# Patient Record
Sex: Female | Born: 1985 | Race: Black or African American | Hispanic: No | Marital: Single | Smoking: Former smoker
Health system: Southern US, Community
[De-identification: ages and names within clinical notes are randomized; demographics above are authoritative.]

## PROBLEM LIST (undated history)

## (undated) ENCOUNTER — Inpatient Hospital Stay (HOSPITAL_COMMUNITY): Payer: Self-pay

## (undated) DIAGNOSIS — B999 Unspecified infectious disease: Secondary | ICD-10-CM

## (undated) DIAGNOSIS — D649 Anemia, unspecified: Secondary | ICD-10-CM

## (undated) DIAGNOSIS — K219 Gastro-esophageal reflux disease without esophagitis: Secondary | ICD-10-CM

## (undated) DIAGNOSIS — R4586 Emotional lability: Secondary | ICD-10-CM

## (undated) DIAGNOSIS — N83209 Unspecified ovarian cyst, unspecified side: Secondary | ICD-10-CM

## (undated) HISTORY — DX: Emotional lability: R45.86

## (undated) HISTORY — PX: TUBAL LIGATION: SHX77

## (undated) HISTORY — PX: INDUCED ABORTION: SHX677

## (undated) HISTORY — PX: TOOTH EXTRACTION: SUR596

---

## 2014-02-14 ENCOUNTER — Emergency Department (HOSPITAL_BASED_OUTPATIENT_CLINIC_OR_DEPARTMENT_OTHER)
Admission: EM | Admit: 2014-02-14 | Discharge: 2014-02-14 | Disposition: A | Discharge status: Home / Self Care | Payer: Medicaid - Out of State | Attending: Emergency Medicine | Admitting: Emergency Medicine

## 2014-02-14 ENCOUNTER — Encounter (HOSPITAL_BASED_OUTPATIENT_CLINIC_OR_DEPARTMENT_OTHER): Payer: Self-pay | Admitting: Emergency Medicine

## 2014-02-14 DIAGNOSIS — Z87891 Personal history of nicotine dependence: Secondary | ICD-10-CM | POA: Insufficient documentation

## 2014-02-14 DIAGNOSIS — O200 Threatened abortion: Secondary | ICD-10-CM | POA: Insufficient documentation

## 2014-02-14 HISTORY — DX: Unspecified ovarian cyst, unspecified side: N83.209

## 2014-02-14 LAB — URINE MICROSCOPIC-ADD ON

## 2014-02-14 LAB — URINALYSIS, ROUTINE W REFLEX MICROSCOPIC
Bilirubin Urine: NEGATIVE
Glucose, UA: NEGATIVE mg/dL
Ketones, ur: 15 mg/dL — AB
NITRITE: NEGATIVE
PH: 6.5 (ref 5.0–8.0)
Protein, ur: NEGATIVE mg/dL
Specific Gravity, Urine: 1.026 (ref 1.005–1.030)
Urobilinogen, UA: 1 mg/dL (ref 0.0–1.0)

## 2014-02-14 LAB — HCG, QUANTITATIVE, PREGNANCY: HCG, BETA CHAIN, QUANT, S: 41578 m[IU]/mL — AB (ref <5)

## 2014-02-14 LAB — PREGNANCY, URINE: Preg Test, Ur: POSITIVE — AB

## 2014-02-14 NOTE — ED Provider Notes (Signed)
CSN: [id number]     Arrival date & time 02/14/14  1929 History  This chart was scribed for Geoffery Lyonsouglas Emmanuela Ghazi, MD by Joaquin MusicKristina Sanchez-Matthews, ED Scribe. This patient was seen in room MH03/MH03 and the patient's care was started at 7:48 PM.   Chief Complaint  Patient presents with  . Vaginal Bleeding   Patient is a 28 y.o. female presenting with vaginal bleeding. The history is provided by the patient. No language interpreter was used.  Vaginal Bleeding Quality: thick and brown. Severity:  Moderate Onset quality:  Sudden Duration:  6 hours Timing:  Intermittent Progression:  Unchanged Chronicity:  New Menstrual history:  Irregular Possible pregnancy: yes   Context: at rest   Relieved by:  Nothing Worsened by:  Nothing tried Ineffective treatments:  None tried Associated symptoms: abdominal pain (suprapubic) and vaginal discharge   Associated symptoms: no back pain, no dizziness and no nausea   Abdominal pain:    Location:  Suprapubic   Quality:  Aching and pressure   Severity:  Moderate   Duration:  5 hours   Timing:  Constant   Progression:  Unchanged   Chronicity:  New Vaginal discharge:    Quality:  Brown and thick   Severity:  Moderate   Onset quality:  Sudden   Timing:  Constant   Progression:  Unchanged Risk factors: no hx of ectopic pregnancy    HPI Comments: Christine Juanetta GoslingHawkins is a 28 y.o. female who presents to the Emergency Department complaining of sudden vaginal bleeding that began this morning. Pt states she found out she was pregnant last week. She states she attempted to go to work but states she was having a HA and suprapubic pain and states she instead stayed home. Her LNMP was March 24-25, 2015. Pt states her blood/discharge is thick and dark brown. She states she has 3 healthy children, reports having an abortion 10 years ago and reports this being her 5th pregnancy. She states she was informed she was pregnant while at [facility] 1 week ago. She states  she went to ED for abd pain, had X-rays and labs done and states she was informed she had a possible UTI but states "she had a scramble egg" of a pregnancy. Denies being informed how far along she is or being discharged with medication due to pregnancy. Pt had an ultrasound done at [facility] and was informed everything WNL and the baby is in the appropriate place.  She denies nausea and emesis.   Past Medical History  Diagnosis Date  . Ovarian cyst    Past Surgical History  Procedure Laterality Date  . Cesarean section     No family history on file. History  Substance Use Topics  . Smoking status: Former Games developermoker  . Smokeless tobacco: Not on file  . Alcohol Use: No   OB History   Grav Para Term Preterm Abortions TAB SAB Ect Mult Living                 Review of Systems  Gastrointestinal: Positive for abdominal pain (suprapubic). Negative for nausea and vomiting.  Genitourinary: Positive for vaginal bleeding and vaginal discharge.  Musculoskeletal: Negative for back pain.  Neurological: Negative for dizziness.  All other systems reviewed and are negative.  Allergies  Review of patient's allergies indicates no known allergies.  Home Medications   Prior to Admission medications   Not on File   BP 119/76  Pulse 92  Temp(Src) 98.7 F (37.1 C) (Oral)  Resp 20  Ht 5\' 7"  (1.702 m)  Wt 200 lb (90.719 kg)  BMI 31.32 kg/m2  SpO2 100%  LMP 01/09/2014  Physical Exam  Nursing note and vitals reviewed. Constitutional: She is oriented to person, place, and time. She appears well-developed and well-nourished. No distress.  HENT:  Head: Normocephalic and atraumatic.  Eyes: EOM are normal.  Neck: Neck supple. No tracheal deviation present.  Cardiovascular: Normal rate.   Pulmonary/Chest: Effort normal. No respiratory distress.  Abdominal: Soft. She exhibits no mass. There is tenderness. There is no rebound and no guarding.  Mild tenderness in the suprapubic region.   Musculoskeletal: Normal range of motion.  Neurological: She is alert and oriented to person, place, and time.  Skin: Skin is warm and dry.  Psychiatric: She has a normal mood and affect. Her behavior is normal.   ED Course  Procedures DIAGNOSTIC STUDIES: Oxygen Saturation is 100% on RA, normal by my interpretation.    COORDINATION OF CARE: 7:53 PM-Discussed treatment plan which includes CBC and UA. Will review pts [facility] visit in order to avoid repeat of tx. Pt agreed to plan.   9:20 PM-Informed pt of lab findings. Advised pt to F/U with OB/GYN and to return to the ED if sx worsen in the next 24-48 hours. Pt agreed to plan.  Labs Review Labs Reviewed  URINALYSIS, ROUTINE W REFLEX MICROSCOPIC - Abnormal; Notable for the following:    APPearance CLOUDY (*)    Hgb urine dipstick SMALL (*)    Ketones, ur 15 (*)    Leukocytes, UA MODERATE (*)    All other components within normal limits  PREGNANCY, URINE - Abnormal; Notable for the following:    Preg Test, Ur POSITIVE (*)    All other components within normal limits  URINE MICROSCOPIC-ADD ON - Abnormal; Notable for the following:    Squamous Epithelial / LPF FEW (*)    Bacteria, UA FEW (*)    All other components within normal limits  HCG, QUANTITATIVE, PREGNANCY - Abnormal; Notable for the following:    hCG, Beta Chain, Quant, S [id number] (*)    All other components within normal limits    Imaging Review No results found.   EKG Interpretation None     MDM   Final diagnoses:  None    Patient is a 28 year old female who presents with vaginal bleeding. This is dark in color and she had one episode of this this afternoon. She denies any significant abdominal pain. She had an extensive workup performed 5 days ago at [facility]. She apparently had a pelvic exam, ultrasound, and full laboratory panel. Her beta hCG at that point was 13,000. Today this 41,000. I see no reason to repeat an ultrasound as we are  ready have documented an IUP. This may well represent a threatened abortion. She will be advised vaginal rest and followup with OB. The [facility].  I personally performed the services described in this documentation, which was scribed in my presence. The recorded information has been reviewed and is accurate.      Geoffery Lyonsouglas Ghali Morissette, MD 02/14/14 [phone]

## 2014-02-14 NOTE — ED Notes (Signed)
Brown vaginal Dc since this morning.  Found out 1 week ago that she is pregnant. LMP March 25th.

## 2014-02-14 NOTE — Discharge Instructions (Signed)
Followup with OB in the next week. The contact information has been provided on this discharge summary.  Return to the ER if he develops severe pain or worsening bleeding.   Threatened Miscarriage Bleeding during the first 20 weeks of pregnancy is common. This is sometimes called a threatened miscarriage. This is a pregnancy that is threatening to end before the twentieth week of pregnancy. Often this bleeding stops with bed rest or decreased activities as suggested by your caregiver and the pregnancy continues without any more problems. You may be asked to not have sexual intercourse, have orgasms or use tampons until further notice. Sometimes a threatened miscarriage can progress to a complete or incomplete miscarriage. This may or may not require further treatment. Some miscarriages occur before a woman misses a menstrual period and knows she is pregnant. Miscarriages occur in 15 to 20% of all pregnancies and usually occur during the first 13 weeks of the pregnancy. The exact cause of a miscarriage is usually never known. A miscarriage is natures way of ending a pregnancy that is abnormal or would not make it to term. There are some things that may put you at risk to have a miscarriage, such as:  Hormone problems.  Infection of the uterus or cervix.  Chronic illness, diabetes for example, especially if it is not controlled.  Abnormal shaped uterus.  Fibroids in the uterus.  Incompetent cervix (the cervix is too weak to hold the baby).  Smoking.  Drinking too much alcohol. It's best not to drink any alcohol when you are pregnant.  Taking illegal drugs. TREATMENT  When a miscarriage becomes complete and all products of conception (all the tissue in the uterus) have been passed, often no treatment is needed. If you think you passed tissue, save it in a container and take it to your doctor for evaluation. If the miscarriage is incomplete (parts of the fetus or placenta remain in the  uterus), further treatment may be needed. The most common reason for further treatment is continued bleeding (hemorrhage) because pregnancy tissue did not pass out of the uterus. This often occurs if a miscarriage is incomplete. Tissue left behind may also become infected. Treatment usually is dilatation and curettage (the removal of the remaining products of pregnancy. This can be done by a simple sucking procedure (suction curettage) or a simple scraping of the inside of the uterus. This may be done in the hospital or in the caregiver's office. This is only done when your caregiver knows that there is no chance for the pregnancy to proceed to term. This is determined by physical examination, negative pregnancy test, falling pregnancy hormone count and/or, an ultrasound revealing a dead fetus. Miscarriages are often a very emotional time for prospective mothers and fathers. This is not you or your partners fault. It did not occur because of an inadequacy in you or your partner. Nearly all miscarriages occur because the pregnancy has started off wrongly. At least half of these pregnancies have a chromosomal abnormality. It is almost always not inherited. Others may have developmental problems with the fetus or placenta. This does not always show up even when the products miscarried are studied under the microscope. The miscarriage is nearly always not your fault and it is not likely that you could have prevented it from happening. If you are having emotional and grieving problems, talk to your health care provider and even seek counseling, if necessary, before getting pregnant again. You can begin trying for another pregnancy as soon  as your caregiver says it is OK. HOME CARE INSTRUCTIONS   Your caregiver may order bed rest depending on how much bleeding and cramping you are having. You may be limited to only getting up to go to the bathroom. You may be allowed to continue light activity. You may need to make  arrangements for the care of your other children and for any other responsibilities.  Keep track of the number of pads you use each day, how often you have to change pads and how saturated (soaked) they are. Record this information.  DO NOT USE TAMPONS. Do not douche, have sexual intercourse or orgasms until approved by your caregiver.  You may receive a follow up appointment for re-evaluation of your pregnancy and a repeat blood test. Re-evaluation often occurs after 2 days and again in 4 to 6 weeks. It is very important that you follow-up in the recommended time period.  If you are Rh negative and the father is Rh positive or you do not know the fathers' blood type, you may receive a shot (Rh immune globulin) to help prevent abnormal antibodies that can develop and affect the baby in any future pregnancies. SEEK IMMEDIATE MEDICAL CARE IF:  You have severe cramps in your stomach, back, or abdomen.  You have a sudden onset of severe pain in the lower part of your abdomen.  You develop chills.  You run an unexplained temperature of 101 F (38.3 C) or higher.  You pass large clots or tissue. Save any tissue for your caregiver to inspect.  Your bleeding increases or you become light-headed, weak, or have fainting episodes.  You have a gush of fluid from your vagina.  You pass out. This could mean you have a tubal (ectopic) pregnancy. Document Released: 10/04/2005 Document Revised: 12/27/2011 Document Reviewed: 05/20/2008 [facility] Patient Information 2014 [employer] [place].

## 2014-02-14 NOTE — ED Notes (Signed)
C/o abd pain that comes and goes,  [employer]otBrown vag discharge x 1 day  Denies burning w urination

## 2014-03-17 ENCOUNTER — Encounter (HOSPITAL_COMMUNITY): Payer: Self-pay | Admitting: *Deleted

## 2014-03-17 ENCOUNTER — Inpatient Hospital Stay (HOSPITAL_COMMUNITY)
Admission: AD | Admit: 2014-03-17 | Discharge: 2014-03-17 | Disposition: A | Discharge status: Home / Self Care | Payer: Medicaid - Out of State | Source: Ambulatory Visit | Attending: Obstetrics and Gynecology | Admitting: Obstetrics and Gynecology

## 2014-03-17 ENCOUNTER — Inpatient Hospital Stay (HOSPITAL_COMMUNITY): Payer: Medicaid - Out of State

## 2014-03-17 DIAGNOSIS — M549 Dorsalgia, unspecified: Secondary | ICD-10-CM | POA: Insufficient documentation

## 2014-03-17 DIAGNOSIS — Z87891 Personal history of nicotine dependence: Secondary | ICD-10-CM | POA: Insufficient documentation

## 2014-03-17 DIAGNOSIS — O99891 Other specified diseases and conditions complicating pregnancy: Secondary | ICD-10-CM | POA: Insufficient documentation

## 2014-03-17 DIAGNOSIS — O9989 Other specified diseases and conditions complicating pregnancy, childbirth and the puerperium: Principal | ICD-10-CM

## 2014-03-17 DIAGNOSIS — R109 Unspecified abdominal pain: Secondary | ICD-10-CM | POA: Insufficient documentation

## 2014-03-17 LAB — URINALYSIS, ROUTINE W REFLEX MICROSCOPIC
Bilirubin Urine: NEGATIVE
Glucose, UA: NEGATIVE mg/dL
Ketones, ur: >80 mg/dL — AB
NITRITE: NEGATIVE
PH: 6 (ref 5.0–8.0)
Protein, ur: 30 mg/dL — AB
Urobilinogen, UA: 0.2 mg/dL (ref 0.0–1.0)

## 2014-03-17 LAB — URINE MICROSCOPIC-ADD ON

## 2014-03-17 IMAGING — US US OB COMP LESS 14 WK
1 series · 14 of 26 positions shown · non-contrast
Comparison: 02/08/2014.

CLINICAL DATA: Abdomen and back pain.  Pregnant

EXAM:
OBSTETRIC <14 WK ULTRASOUND
TECHNIQUE: Transabdominal ultrasound was performed for evaluation of the
gestation as well as the maternal uterus and adnexal regions.

[Series 1: us ob comp less 14 wks · 14 of 26 slices shown]
[im 1/26]
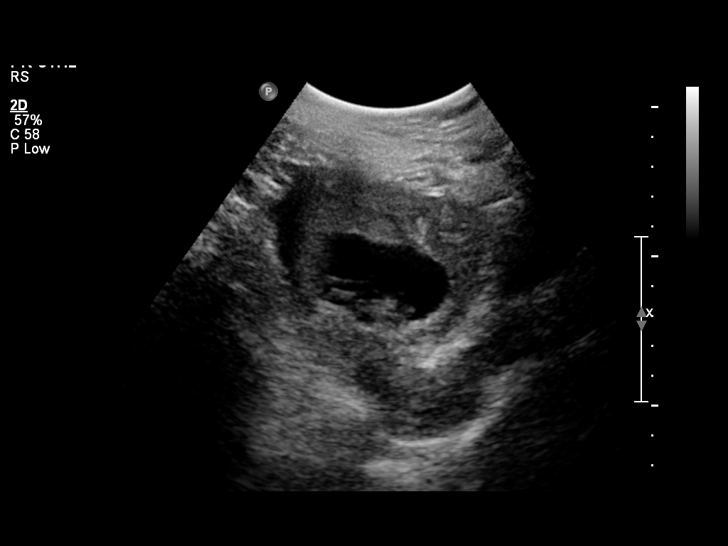
[im 3/26]
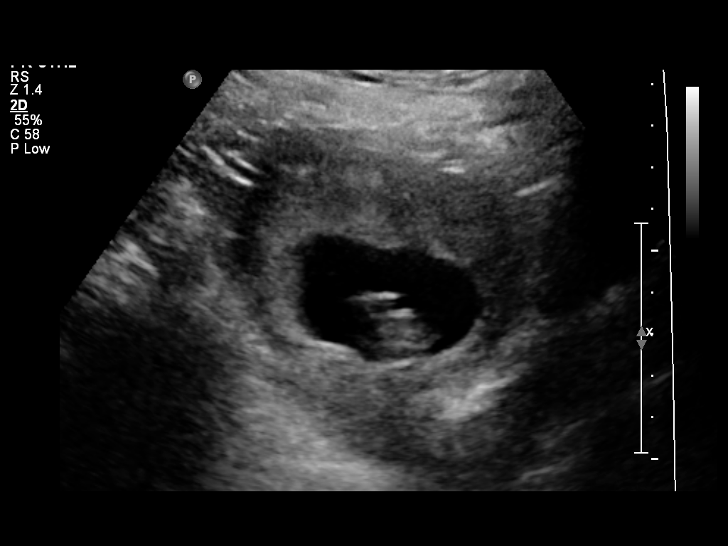
[im 5/26]
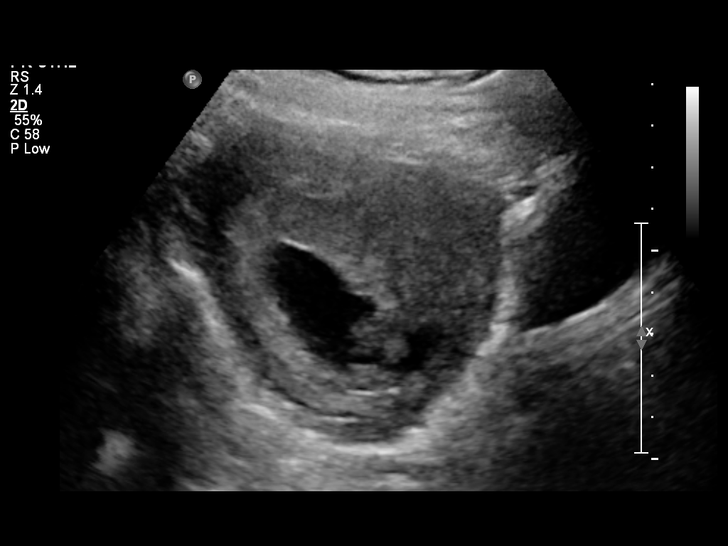
[im 7/26]
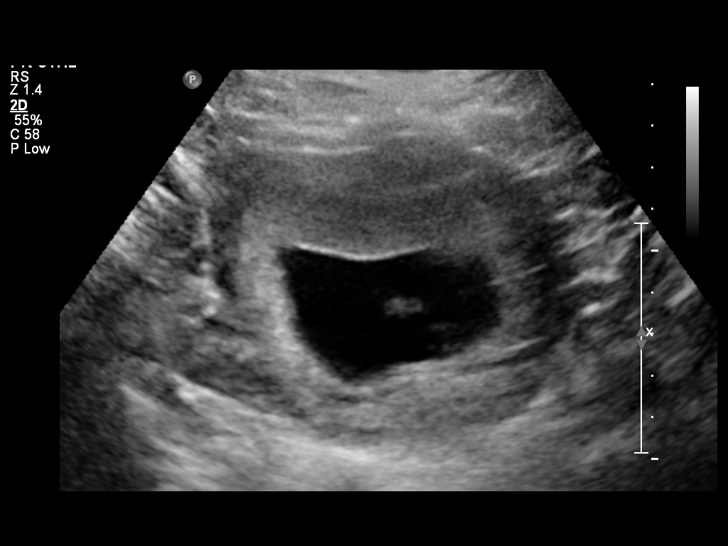
[im 9/26]
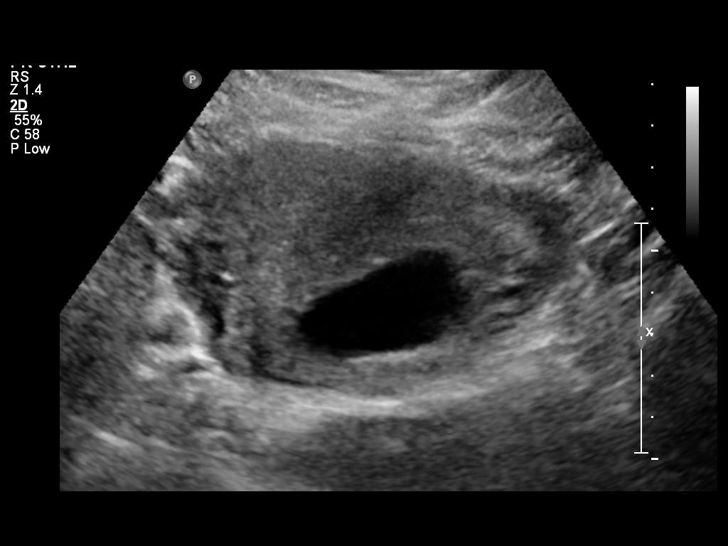
[im 11/26]
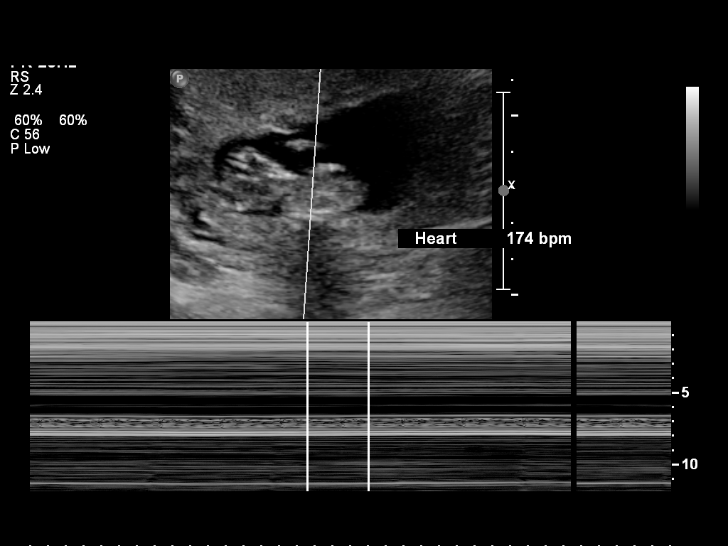
[im 13/26]
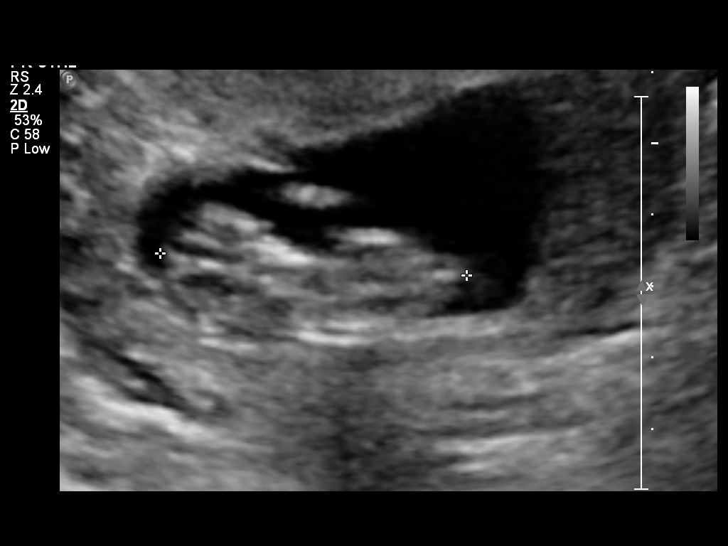
[im 14/26]
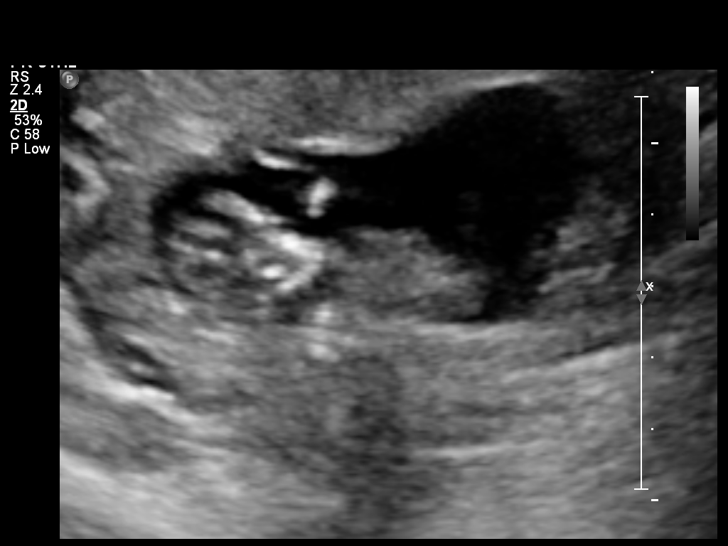
[im 16/26]
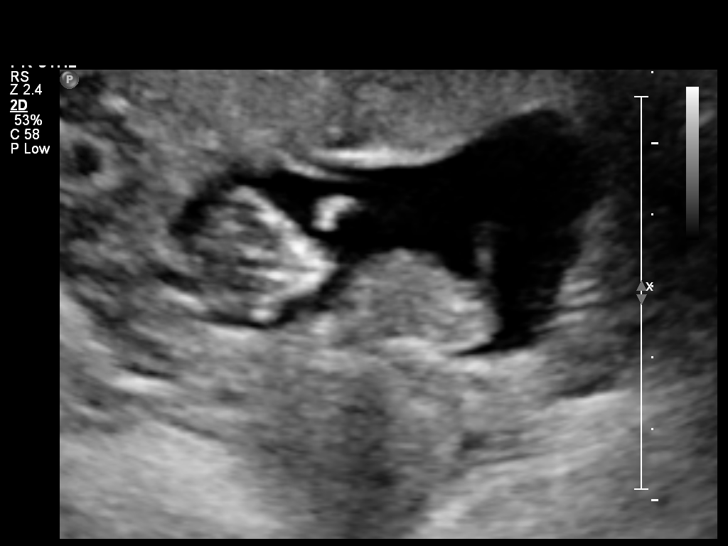
[im 18/26]
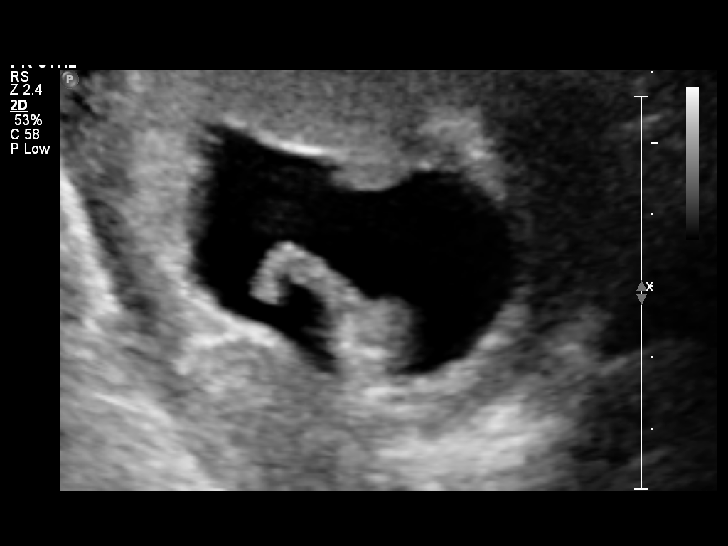
[im 20/26]
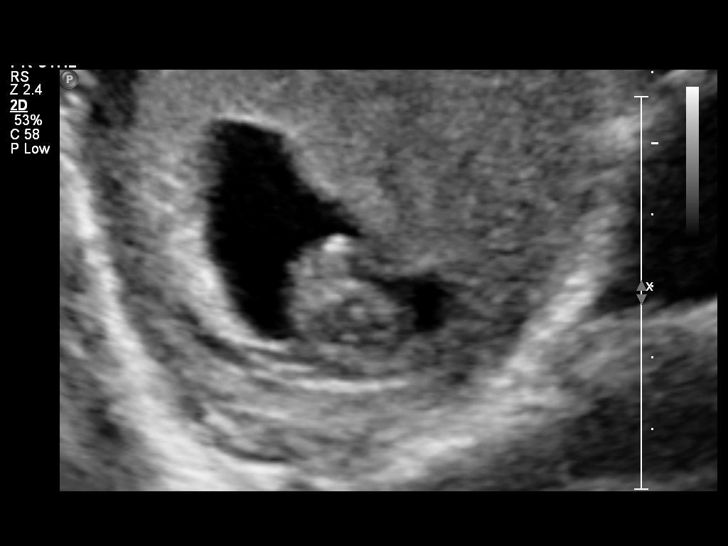
[im 22/26]
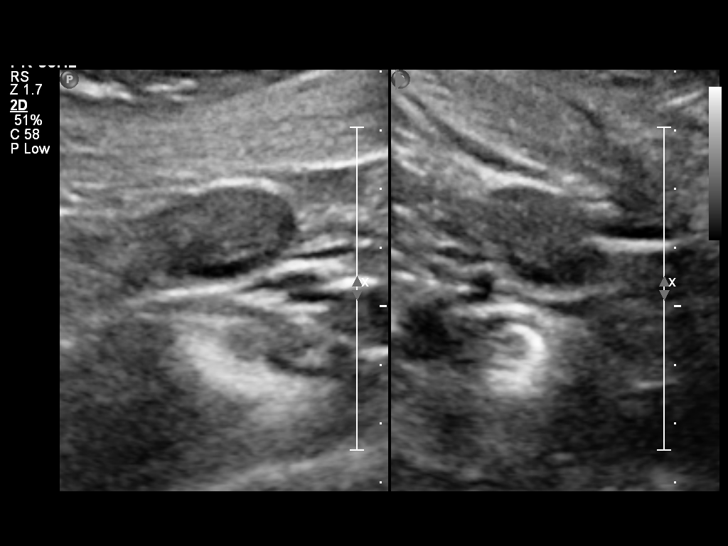
[im 24/26]
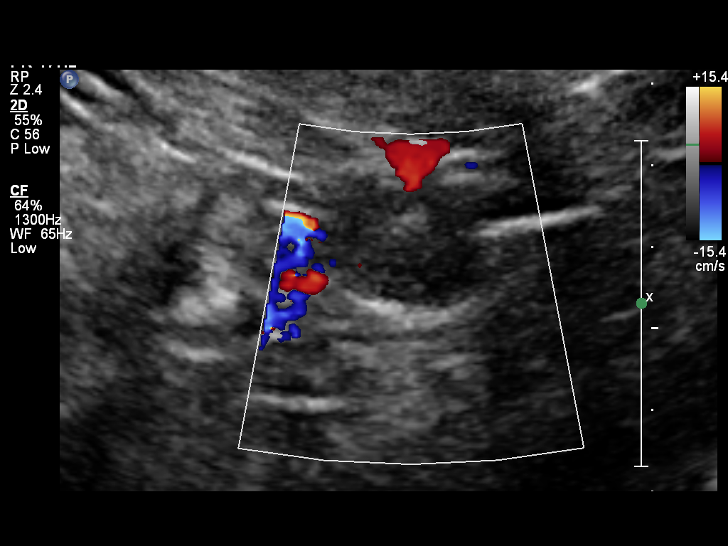
[im 26/26]
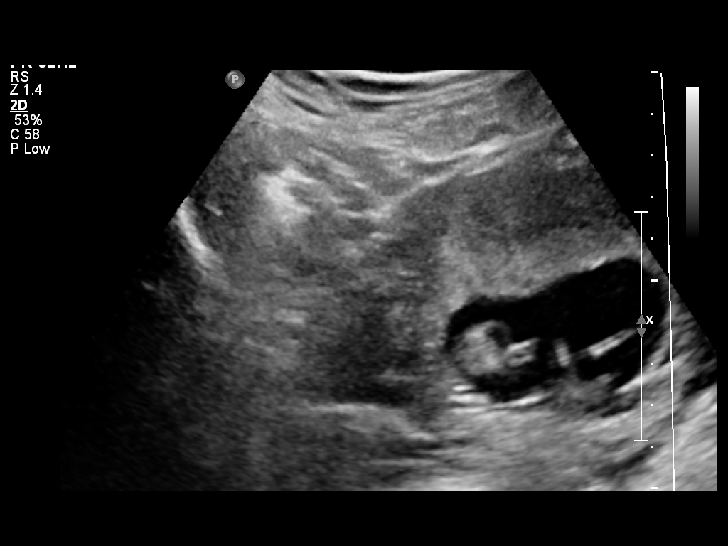

[14 of 26 positions shown; findings below may reference images not displayed]

FINDINGS: Intrauterine gestational sac: Visualized/normal in shape.

Yolk sac:  No

Embryo:  Yes

Cardiac Activity: Yes

Heart Rate: 174 bpm

CRL:   44  mm   11 w 2 d                  US EDC: 10/04/2014

Maternal uterus/adnexae: No subchorionic hemorrhage. No uterine
mass. Normal left ovary. Right ovary not visualized. No adnexal
masses. No free fluid.
IMPRESSION: Single live intrauterine pregnancy with a measured gestational age
of 11 weeks and 2 days, which is consistent with the expected growth
from the prior ultrasound. No emergent pregnancy or maternal
complication. No findings to explain abdominal pain.

## 2014-03-17 MED ORDER — CYCLOBENZAPRINE HCL 10 MG PO TABS: 10.0000 mg | ORAL_TABLET | Freq: Three times a day (TID) | ORAL | Status: DC | PRN

## 2014-03-17 MED ORDER — CYCLOBENZAPRINE HCL 10 MG PO TABS
10.0000 mg | ORAL_TABLET | Freq: Once | ORAL | Status: AC
  Administered 2014-03-17: 10 mg via ORAL
  Filled 2014-03-17: qty 1

## 2014-03-17 NOTE — Discharge Instructions (Signed)

## 2014-03-17 NOTE — MAU Provider Note (Signed)
History     CSN: [id number]  Arrival date and time: 03/17/14 2811   First Provider Initiated Contact with Patient 03/17/14 1923      Chief Complaint  Patient presents with  . Back Pain  . Abdominal Pain  . Dizziness   HPI This is a 28 y.o. female at [redacted]w[redacted]d who presents with c/o dizziness (she thinks due to nausea/vomiting) and low back pain which started last night. Has no abdominal pain now   Denies bleeding. Was seen at [facility] a month ago and had an [place] which she said showed "a small baby which was too small to see the heartbeat".    RN Note: Patient presents with complaints of dizziness, lower abdominal and lower back pain since last night       OB History   Grav Para Term Preterm Abortions TAB SAB Ect Mult Living   1               Past Medical History  Diagnosis Date  . Ovarian cyst     Past Surgical History  Procedure Laterality Date  . Cesarean section      History reviewed. No pertinent family history.  History  Substance Use Topics  . Smoking status: Former Games developer  . Smokeless tobacco: Never Used  . Alcohol Use: No    Allergies: No Known Allergies  No prescriptions prior to admission    Review of Systems  Constitutional: Negative for fever, chills and malaise/fatigue.  Eyes: Negative for blurred vision.  Gastrointestinal: Positive for nausea and vomiting. Negative for abdominal pain, diarrhea and constipation.  Genitourinary: Negative for dysuria.  Musculoskeletal: Positive for back pain.  Neurological: Positive for dizziness and weakness.   Physical Exam   Blood pressure 98/84, pulse 106, temperature 98.5 F (36.9 C), temperature source Oral, resp. rate 16, height 5\' 7"  (1.702 m), weight 85.985 kg (189 lb 9 oz), last menstrual period 01/09/2014.  Physical Exam  Constitutional: She is oriented to person, place, and time. She appears well-developed and well-nourished. No distress.  HENT:  Head: Normocephalic.  Cardiovascular:  Normal rate and regular rhythm.   Respiratory: Effort normal. No respiratory distress.  GI: Soft.  Genitourinary: Vagina normal and uterus normal. No vaginal discharge found.  Cervix long and closed No bleeding   Musculoskeletal: Normal range of motion.  Neurological: She is alert and oriented to person, place, and time.  Skin: Skin is warm and dry.  Psychiatric: She has a normal mood and affect.  Back is tender on bilateral paraspinous muscles  Results for orders placed during the hospital encounter of 03/17/14 (from the past 24 hour(s))  URINALYSIS, ROUTINE W REFLEX MICROSCOPIC     Status: Abnormal   Collection Time    03/17/14  6:55 PM      Result Value Ref Range   Color, Urine YELLOW  YELLOW   APPearance CLEAR  CLEAR   Specific Gravity, Urine >1.030 (*) 1.005 - 1.030   pH 6.0  5.0 - 8.0   Glucose, UA NEGATIVE  NEGATIVE mg/dL   Hgb urine dipstick SMALL (*) NEGATIVE   Bilirubin Urine NEGATIVE  NEGATIVE   Ketones, ur >80 (*) NEGATIVE mg/dL   Protein, ur 30 (*) NEGATIVE mg/dL   Urobilinogen, UA 0.2  0.0 - 1.0 mg/dL   Nitrite NEGATIVE  NEGATIVE   Leukocytes, UA TRACE (*) NEGATIVE  URINE MICROSCOPIC-ADD ON     Status: Abnormal   Collection Time    03/17/14  6:55 PM  Result Value Ref Range   Squamous Epithelial / LPF FEW (*) RARE   WBC, UA 3-6  <3 WBC/hpf   RBC / HPF 7-10  <3 RBC/hpf   Bacteria, UA FEW (*) RARE   Urine-Other MUCOUS PRESENT      MAU Course  Procedures  MDM Flexeril given for back Will check US to verify viability [place] Ob Comp Less 14 Wks  03/17/2014   CLINICAL DATA:  Abdomen and back pain.  Pregnant  EXAM: OBSTETRIC <14 WK ULTRASOUND  TECHNIQUE: Transabdominal ultrasound was performed for evaluation of the gestation as well as the maternal uterus and adnexal regions.  COMPARISON:  02/08/2014.    FINDINGS: Intrauterine gestational sac: Visualized/normal in shape.  Yolk sac:  No  Embryo:  Yes  Cardiac Activity: Yes  Heart Rate: 174 bpm  CRL:   44  mm    11 w 2 d                  US EDC: 10/04/2014  Maternal uterus/adnexae: No subchorionic hemorrhage. No uterine mass. Normal left ovary. Right ovary not visualized. No adnexal masses. No free fluid.    IMPRESSION: Single live intrauterine pregnancy with a measured gestational age of [redacted] weeks and 2 days, which is consistent with the expected growth from the prior ultrasound. No emergent pregnancy or maternal complication. No findings to explain abdominal pain.     Electronically Signed   By: Amie Portlandavid  Ormond M.D.   On: 03/17/2014 21:10   Assessment and Plan  A:  SIUP at 6775w2d by US      Viable fetus       Back pain, probably spasm  P;  Discharge home       Rx Flexeril       Followup with prenatal care    Urine to culture (denies dysuria) due to UA results  Christine Cooke 03/17/2014, 7:28 PM

## 2014-03-17 NOTE — MAU Note (Signed)
Patient presents with complaints of dizziness, lower abdominal and lower back pain since last night

## 2014-03-18 NOTE — MAU Provider Note (Signed)
Attestation of Attending Supervision of Advanced Practitioner: Evaluation and management procedures were performed by the PA/NP/CNM/OB Fellow under my supervision/collaboration. Chart reviewed and agree with management and plan.  Christin Bach V 03/18/2014 1:02 PM

## 2014-05-12 ENCOUNTER — Encounter (HOSPITAL_BASED_OUTPATIENT_CLINIC_OR_DEPARTMENT_OTHER): Payer: Self-pay | Admitting: Emergency Medicine

## 2014-05-12 ENCOUNTER — Emergency Department (HOSPITAL_BASED_OUTPATIENT_CLINIC_OR_DEPARTMENT_OTHER)
Admission: EM | Admit: 2014-05-12 | Discharge: 2014-05-12 | Disposition: A | Discharge status: Home / Self Care | Payer: BC Managed Care – PPO | Attending: Emergency Medicine | Admitting: Emergency Medicine

## 2014-05-12 DIAGNOSIS — A499 Bacterial infection, unspecified: Secondary | ICD-10-CM | POA: Diagnosis not present

## 2014-05-12 DIAGNOSIS — O210 Mild hyperemesis gravidarum: Secondary | ICD-10-CM | POA: Insufficient documentation

## 2014-05-12 DIAGNOSIS — O2390 Unspecified genitourinary tract infection in pregnancy, unspecified trimester: Secondary | ICD-10-CM | POA: Diagnosis not present

## 2014-05-12 DIAGNOSIS — Z87891 Personal history of nicotine dependence: Secondary | ICD-10-CM | POA: Diagnosis not present

## 2014-05-12 DIAGNOSIS — B9689 Other specified bacterial agents as the cause of diseases classified elsewhere: Secondary | ICD-10-CM | POA: Diagnosis not present

## 2014-05-12 DIAGNOSIS — N760 Acute vaginitis: Secondary | ICD-10-CM | POA: Diagnosis not present

## 2014-05-12 DIAGNOSIS — O9989 Other specified diseases and conditions complicating pregnancy, childbirth and the puerperium: Secondary | ICD-10-CM | POA: Diagnosis not present

## 2014-05-12 DIAGNOSIS — Z9889 Other specified postprocedural states: Secondary | ICD-10-CM | POA: Insufficient documentation

## 2014-05-12 DIAGNOSIS — R109 Unspecified abdominal pain: Secondary | ICD-10-CM | POA: Insufficient documentation

## 2014-05-12 LAB — WET PREP, GENITAL
Trich, Wet Prep: NONE SEEN
Yeast Wet Prep HPF POC: NONE SEEN

## 2014-05-12 LAB — URINALYSIS, ROUTINE W REFLEX MICROSCOPIC
BILIRUBIN URINE: NEGATIVE
GLUCOSE, UA: NEGATIVE mg/dL
Hgb urine dipstick: NEGATIVE
Ketones, ur: 40 mg/dL — AB
LEUKOCYTES UA: NEGATIVE
Nitrite: NEGATIVE
PROTEIN: NEGATIVE mg/dL
Specific Gravity, Urine: 1.013 (ref 1.005–1.030)
Urobilinogen, UA: 2 mg/dL — ABNORMAL HIGH (ref 0.0–1.0)
pH: 6.5 (ref 5.0–8.0)

## 2014-05-12 MED ORDER — METRONIDAZOLE 500 MG PO TABS: 500.0000 mg | ORAL_TABLET | Freq: Two times a day (BID) | ORAL | Status: DC

## 2014-05-12 NOTE — ED Notes (Signed)
MD at bedside. 

## 2014-05-12 NOTE — ED Provider Notes (Signed)
CSN: [id number]     Arrival date & time 05/12/14  1713 History  This chart was scribed for Rolan Bucco, MD by Nicholos Johns, ED scribe. This patient was seen in room MH12/MH12 and the patient's care was started at 5:56 PM.    Chief Complaint  Patient presents with  . Dysuria  . Vaginal Discharge   The history is provided by the patient. No language interpreter was used.   HPI Comments: Christine Cooke is a 28 y.o. female who in [redacted] weeks pregnant presents to the Emergency Department complaining of intermittent suprapubic abdominal pressure and cramping and white vaginal discharge. Abdominal pressure worse with urination. Also reports some nausea and emesis with pregnancy and has had occasional small amount of blood in emesis, not persistent or worsening.  States she can feel the baby starting to move. This is patient's 5th pregnancy; had 3 full term through c-section, 1 abortion, and this will be 4th child. Just got Medicaid and card and states she is still trying to find an OB/GYN to see her for prenatal visits. Has so far been unsuccessful. Denies vaginal bleeding.   Past Medical History  Diagnosis Date  . Ovarian cyst    Past Surgical History  Procedure Laterality Date  . Cesarean section    . Induced abortion     History reviewed. No pertinent family history. History  Substance Use Topics  . Smoking status: Former Games developer  . Smokeless tobacco: Never Used  . Alcohol Use: No   OB History   Grav Para Term Preterm Abortions TAB SAB Ect Mult Living   5 3 3  1 1    3      Review of Systems  Constitutional: Negative for chills, diaphoresis and fatigue.  HENT: Negative for congestion, rhinorrhea and sneezing.   Eyes: Negative.   Respiratory: Negative for cough, chest tightness and shortness of breath.   Cardiovascular: Negative for chest pain and leg swelling.  Gastrointestinal: Positive for nausea, vomiting and abdominal pain. Negative for diarrhea and blood in stool.   Genitourinary: Positive for dysuria and vaginal discharge. Negative for frequency, hematuria, flank pain, vaginal bleeding and difficulty urinating.  Musculoskeletal: Negative for arthralgias and back pain.  Skin: Negative for rash.  Neurological: Negative for dizziness, speech difficulty, weakness, numbness and headaches.  All other systems reviewed and are negative.  Allergies  Review of patient's allergies indicates no known allergies.  Home Medications   Prior to Admission medications   Medication Sig Start Date End Date Taking? Authorizing Provider  cyclobenzaprine (FLEXERIL) 10 MG tablet Take 1 tablet (10 mg total) by mouth 3 (three) times daily as needed for muscle spasms. 03/17/14   Aviva Signs, CNM  metroNIDAZOLE (FLAGYL) 500 MG tablet Take 1 tablet (500 mg total) by mouth 2 (two) times daily. One po bid x 7 days 05/12/14   Rolan Bucco, MD   Triage vitals: BP 102/75  Pulse 104  Temp(Src) 97.9 F (36.6 C) (Oral)  Resp 18  Ht 5\' 6"  (1.676 m)  Wt 180 lb (81.647 kg)  BMI 29.07 kg/m2  SpO2 100%  LMP 01/09/2014  Physical Exam  Constitutional: She is oriented to person, place, and time. She appears well-developed and well-nourished.  HENT:  Head: Normocephalic and atraumatic.  Eyes: Pupils are equal, round, and reactive to light.  Neck: Normal range of motion. Neck supple.  Cardiovascular: Normal rate, regular rhythm and normal heart sounds.   Pulmonary/Chest: Effort normal and breath sounds normal. No respiratory distress. She has  no wheezes. She has no rales. She exhibits no tenderness.  Abdominal: Soft. Bowel sounds are normal. There is no tenderness. There is no rebound and no guarding.  Genitourinary: Cervix exhibits no motion tenderness. Right adnexum displays no tenderness. Left adnexum displays no tenderness. No bleeding around the vagina. Vaginal discharge found.  Some thick white discharge. Cervix is closed. No bleeding or pooling of fluid. No CMT or adnexal  tenderness.  Musculoskeletal: Normal range of motion. She exhibits no edema.  Lymphadenopathy:    She has no cervical adenopathy.  Neurological: She is alert and oriented to person, place, and time.  Skin: Skin is warm and dry. No rash noted.  Psychiatric: She has a normal mood and affect.    ED Course  Procedures (including critical care time) DIAGNOSTIC STUDIES: Oxygen Saturation is 100% on room air, normal by my interpretation.    COORDINATION OF CARE: At 6:01 PM: Discussed treatment plan with patient which includes a pelvic exam and lab work. Patient agrees.    Labs Review Results for orders placed during the hospital encounter of 05/12/14  WET PREP, GENITAL      Result Value Ref Range   Yeast Wet Prep HPF POC NONE SEEN  NONE SEEN   Trich, Wet Prep NONE SEEN  NONE SEEN   Clue Cells Wet Prep HPF POC TOO NUMEROUS TO COUNT (*) NONE SEEN   WBC, Wet Prep HPF POC FEW (*) NONE SEEN  URINALYSIS, ROUTINE W REFLEX MICROSCOPIC      Result Value Ref Range   Color, Urine YELLOW  YELLOW   APPearance CLEAR  CLEAR   Specific Gravity, Urine 1.013  1.005 - 1.030   pH 6.5  5.0 - 8.0   Glucose, UA NEGATIVE  NEGATIVE mg/dL   Hgb urine dipstick NEGATIVE  NEGATIVE   Bilirubin Urine NEGATIVE  NEGATIVE   Ketones, ur 40 (*) NEGATIVE mg/dL   Protein, ur NEGATIVE  NEGATIVE mg/dL   Urobilinogen, UA 2.0 (*) 0.0 - 1.0 mg/dL   Nitrite NEGATIVE  NEGATIVE   Leukocytes, UA NEGATIVE  NEGATIVE   No results found.   Imaging Review No results found.   EKG Interpretation None      MDM   Final diagnoses:  BV (bacterial vaginosis)   Pt with lower abd cramping, vaginal discharge in 2nd trimester pregnancy <20 weeks.  EDD 10/16/14 which puts her at 2073w4d.  +FHTs at 120.  Cervix closed.  +BV which will be treated with flagyl.  Denies need for antiemetic meds.  Has protein in urine, but BP good.  Advised pt to take a PNV.  Advised to make appt with ob/gyn ASAP.  [facility] as  needed until then.   I personally performed the services described in this documentation, which was scribed in my presence. The recorded information has been reviewed and is accurate.     Rolan BuccoMelanie Berry Gallacher, MD 05/12/14 [phone]

## 2014-05-12 NOTE — ED Notes (Signed)
Pt having pressure and pain at pelvis, especially when urinating.  Some yellow vaginal discharge.  No vaginal bleeding.  No known fever.  Pt having some emesis with pregnancy but pt concerned that she is having some blood in her emesis.  First prenatal visit in August.

## 2014-05-12 NOTE — Discharge Instructions (Signed)
Bacterial Vaginosis Bacterial vaginosis is a vaginal infection that occurs when the normal balance of bacteria in the vagina is disrupted. It results from an overgrowth of certain bacteria. This is the most common vaginal infection in women of childbearing age. Treatment is important to prevent complications, especially in pregnant women, as it can cause a premature delivery. CAUSES  Bacterial vaginosis is caused by an increase in harmful bacteria that are normally present in smaller amounts in the vagina. Several different kinds of bacteria can cause bacterial vaginosis. However, the reason that the condition develops is not fully understood. RISK FACTORS Certain activities or behaviors can put you at an increased risk of developing bacterial vaginosis, including:  Having a new sex partner or multiple sex partners.  Douching.  Using an intrauterine device (IUD) for contraception. Women do not get bacterial vaginosis from toilet seats, bedding, swimming pools, or contact with objects around them. SIGNS AND SYMPTOMS  Some women with bacterial vaginosis have no signs or symptoms. Common symptoms include:  Grey vaginal discharge.  A fishlike odor with discharge, especially after sexual intercourse.  Itching or burning of the vagina and vulva.  Burning or pain with urination. DIAGNOSIS  Your health care provider will take a medical history and examine the vagina for signs of bacterial vaginosis. A sample of vaginal fluid may be taken. Your health care provider will look at this sample under a microscope to check for bacteria and abnormal cells. A vaginal pH test may also be done.  TREATMENT  Bacterial vaginosis may be treated with antibiotic medicines. These may be given in the form of a pill or a vaginal cream. A second round of antibiotics may be prescribed if the condition comes back after treatment.  HOME CARE INSTRUCTIONS   Only take over-the-counter or prescription medicines as  directed by your health care provider.  If antibiotic medicine was prescribed, take it as directed. Make sure you finish it even if you start to feel better.  Do not have sex until treatment is completed.  Tell all sexual partners that you have a vaginal infection. They should see their health care provider and be treated if they have problems, such as a mild rash or itching.  Practice safe sex by using condoms and only having one sex partner. SEEK MEDICAL CARE IF:   Your symptoms are not improving after 3 days of treatment.  You have increased discharge or pain.  You have a fever. MAKE SURE YOU:   Understand these instructions.  Will watch your condition.  Will get help right away if you are not doing well or get worse. FOR MORE INFORMATION  Centers for Disease Control and Prevention, Division of STD Prevention: www.cdc.gov/std American Sexual Health Association (ASHA): www.ashastd.org  Document Released: 10/04/2005 Document Revised: 07/25/2013 Document Reviewed: 05/16/2013 ExitCare Patient Information 2015 [employer]. This information is not intended to replace advice given to you by your health care provider. Make sure you discuss any questions you have with your health care provider.  

## 2014-05-13 LAB — GC/CHLAMYDIA PROBE AMP
CT PROBE, AMP APTIMA: NEGATIVE
GC PROBE AMP APTIMA: NEGATIVE

## 2014-05-15 ENCOUNTER — Other Ambulatory Visit: Payer: Medicaid Other

## 2014-05-15 DIAGNOSIS — Z3492 Encounter for supervision of normal pregnancy, unspecified, second trimester: Secondary | ICD-10-CM

## 2014-05-16 LAB — OBSTETRIC PANEL
ANTIBODY SCREEN: NEGATIVE
BASOS ABS: 0 10*3/uL (ref 0.0–0.1)
Basophils Relative: 0 % (ref 0–1)
EOS ABS: 0 10*3/uL (ref 0.0–0.7)
EOS PCT: 0 % (ref 0–5)
HEMATOCRIT: 30.4 % — AB (ref 36.0–46.0)
Hemoglobin: 9.9 g/dL — ABNORMAL LOW (ref 12.0–15.0)
Hepatitis B Surface Ag: NEGATIVE
LYMPHS ABS: 1.6 10*3/uL (ref 0.7–4.0)
LYMPHS PCT: 24 % (ref 12–46)
MCH: 28.5 pg (ref 26.0–34.0)
MCHC: 32.6 g/dL (ref 30.0–36.0)
MCV: 87.6 fL (ref 78.0–100.0)
MONO ABS: 0.3 10*3/uL (ref 0.1–1.0)
Monocytes Relative: 5 % (ref 3–12)
Neutro Abs: 4.8 10*3/uL (ref 1.7–7.7)
Neutrophils Relative %: 71 % (ref 43–77)
Platelets: 201 10*3/uL (ref 150–400)
RBC: 3.47 MIL/uL — ABNORMAL LOW (ref 3.87–5.11)
RDW: 14.5 % (ref 11.5–15.5)
RUBELLA: 0.78 {index} (ref <0.90)
Rh Type: POSITIVE
WBC: 6.8 10*3/uL (ref 4.0–10.5)

## 2014-05-16 LAB — GC/CHLAMYDIA PROBE AMP
CT Probe RNA: NEGATIVE
GC Probe RNA: NEGATIVE

## 2014-05-16 LAB — HIV ANTIBODY (ROUTINE TESTING W REFLEX): HIV 1&2 Ab, 4th Generation: NONREACTIVE

## 2014-05-17 LAB — HEMOGLOBINOPATHY EVALUATION
HEMOGLOBIN OTHER: 0 %
HGB A2 QUANT: 2.8 % (ref 2.2–3.2)
HGB A: 96.8 % (ref 96.8–97.8)
Hgb F Quant: 0.4 % (ref 0.0–2.0)
Hgb S Quant: 0 %

## 2014-05-18 LAB — CULTURE, OB URINE: Colony Count: 75000

## 2014-05-18 LAB — CANNABANOIDS (GC/LC/MS), URINE: THC-COOH (GC/LC/MS), ur confirm: 445 ng/mL — AB (ref <5)

## 2014-05-21 ENCOUNTER — Ambulatory Visit (HOSPITAL_COMMUNITY)
Admission: RE | Admit: 2014-05-21 | Discharge: 2014-05-21 | Disposition: A | Discharge status: Home / Self Care | Payer: BC Managed Care – PPO | Source: Ambulatory Visit | Attending: Obstetrics & Gynecology | Admitting: Obstetrics & Gynecology

## 2014-05-21 DIAGNOSIS — Z3492 Encounter for supervision of normal pregnancy, unspecified, second trimester: Secondary | ICD-10-CM

## 2014-05-21 DIAGNOSIS — Z3689 Encounter for other specified antenatal screening: Secondary | ICD-10-CM | POA: Insufficient documentation

## 2014-05-21 LAB — PRESCRIPTION MONITORING PROFILE (19 PANEL)
Amphetamine/Meth: NEGATIVE ng/mL
Barbiturate Screen, Urine: NEGATIVE ng/mL
Benzodiazepine Screen, Urine: NEGATIVE ng/mL
Buprenorphine, Urine: NEGATIVE ng/mL
Carisoprodol, Urine: NEGATIVE ng/mL
Cocaine Metabolites: NEGATIVE ng/mL
Creatinine, Urine: 279.62 mg/dL
Fentanyl, Ur: NEGATIVE ng/mL
MDMA URINE: NEGATIVE ng/mL
Meperidine, Ur: NEGATIVE ng/mL
Methadone Screen, Urine: NEGATIVE ng/mL
Methaqualone: NEGATIVE ng/mL
Nitrites, Initial: NEGATIVE ug/mL
Opiate Screen, Urine: NEGATIVE ng/mL
Oxycodone Screen, Ur: NEGATIVE ng/mL
Phencyclidine, Ur: NEGATIVE ng/mL
Propoxyphene: NEGATIVE ng/mL
Tapentadol, urine: NEGATIVE ng/mL
Tramadol Scrn, Ur: NEGATIVE ng/mL
Zolpidem, Urine: NEGATIVE ng/mL
pH, Initial: 7.6 pH (ref 4.5–8.9)

## 2014-05-21 IMAGING — US US OB COMP +14 WK
1 series · 12 of 28 positions shown · non-contrast
Comparison: none

OBSTETRICS REPORT
                      (Signed Final 05/21/2014 [DATE])

Service(s) Provided
 US OB COMP + 14 WK                                    76805.1
Indications
 Basic anatomic survey
 Previous cesarean section
Fetal Evaluation
 Num Of Fetuses:    1
 Fetal Heart Rate:  146                          bpm
 Cardiac Activity:  Observed
 Presentation:      Cephalic
 Placenta:          Posterior, above cervical
                    os
 P. Cord            Visualized, central
 Insertion:
 Amniotic Fluid
 AFI FV:      Subjectively within normal limits
                                             Larg Pckt:     5.3  cm
Biometry
 BPD:     49.4  mm     G. Age:  20w 6d                CI:        77.84   70 - 86
                                                      FL/HC:      20.0   15.9 -
 HC:     177.2  mm     G. Age:  20w 2d       25  %    HC/AC:      1.09   1.06 -
 AC:     162.6  mm     G. Age:  21w 3d       69  %    FL/BPD:
 FL:      35.5  mm     G. Age:  21w 1d       64  %    FL/AC:      21.8   20 - 24
 NFT:     3.32  mm
 Est. FW:     404  gm    0 lb 14 oz      55  %
Gestational Age
 LMP:           18w 6d        Date:  01/09/14                 EDD:   10/16/14
 U/S Today:     21w 0d                                        EDD:   10/01/14
 Best:          20w 4d     Det. By:  Early Ultrasound         EDD:   10/04/14
Anatomy
 Cranium:          Appears normal         Aortic Arch:      Not well visualized
 Fetal Cavum:      Appears normal         Ductal Arch:      Appears normal
 Ventricles:       Appears normal         Diaphragm:        Appears normal
 Choroid Plexus:   Appears normal         Stomach:          Appears normal, left
                                                            sided
 Cerebellum:       Appears normal         Abdomen:          Appears normal
 Posterior Fossa:  Appears normal         Abdominal Wall:   Appears nml (cord
                                                            insert, abd wall)
 Nuchal Fold:      Appears normal         Cord Vessels:     Appears normal (3
                                                            vessel cord)
 Face:             Appears normal         Kidneys:          Appear normal
                   (orbits and profile)
 Lips:             Appears normal         Bladder:          Appears normal
 Heart:            Appears normal         Spine:            Appears normal
                   (4CH, axis, and
                   situs)
 RVOT:             Not well visualized    Lower             Appears normal
                                          Extremities:
 LVOT:             Not well visualized    Upper             Appears normal
 Other:  Parents do not wish to know sex of fetus. Fetus appears to be a male.
         Heels and 5th digit visualized. Nasal bone visualized. Technically
         difficult due to fetal position.
Targeted Anatomy
 Fetal Central Nervous System
 Cisterna Magna:
Cervix Uterus Adnexa
 Cervical Length:    4.3      cm
 Cervix:       Normal appearance by transabdominal scan.
 Left Ovary:    Within normal limits.
 Right Ovary:   Within normal limits.
 Adnexa:     No abnormality visualized.
Impression
INDICATION: 27 yr old [id number] at 16w9d by early ultrasound
 for fetal anatomic survey. Remote read.

[Series 1: us ob comp +14 wk · 82 acquisitions, 12 frames shown]
[im 4/82]
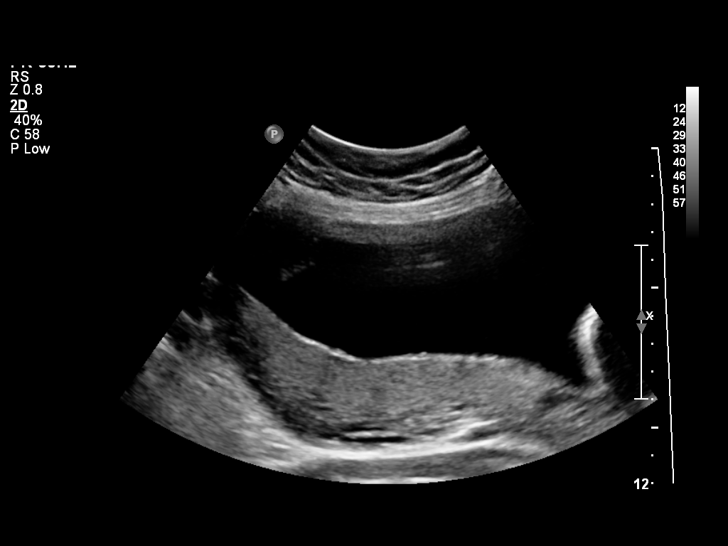
[im 10/82]
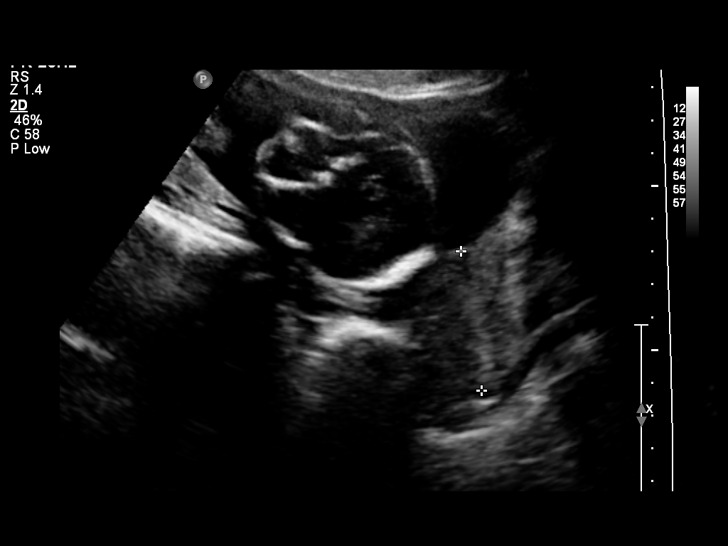
[im 16/82]
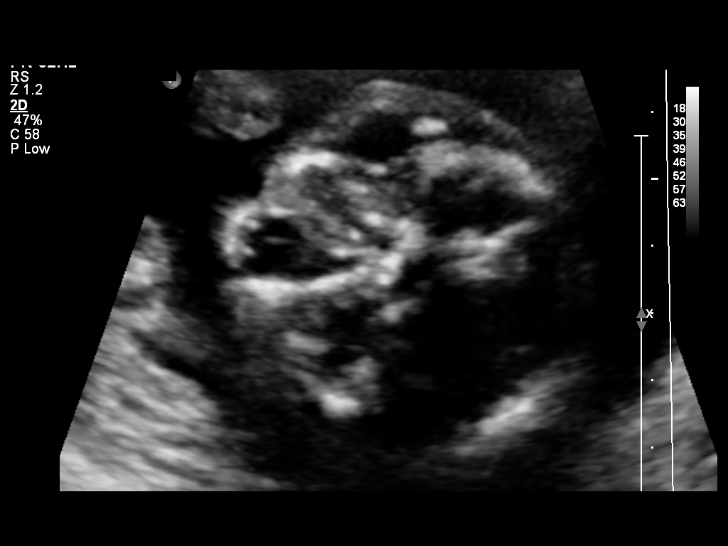
[im 25/82]
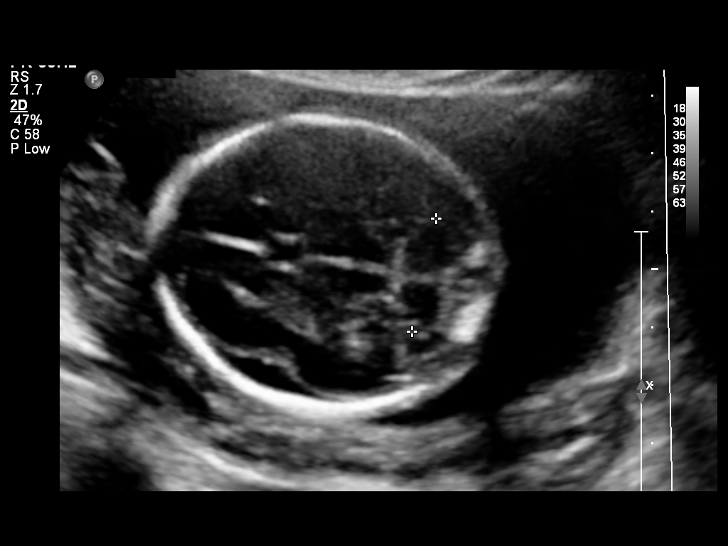
[im 31/82]
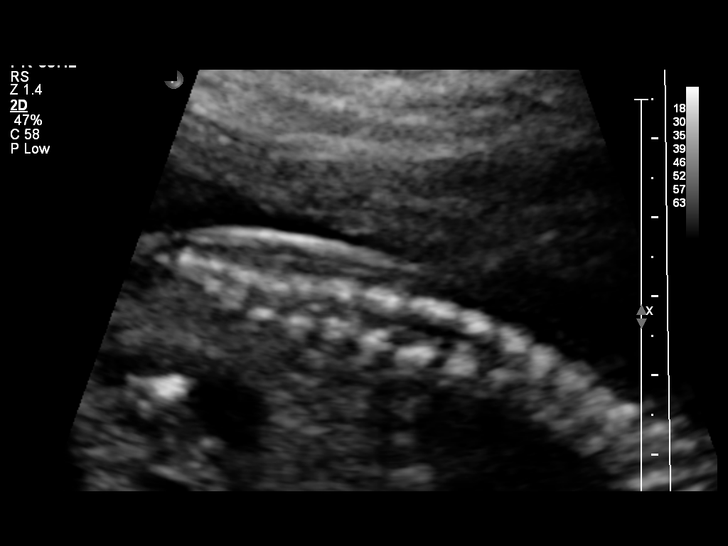
[im 37/82]
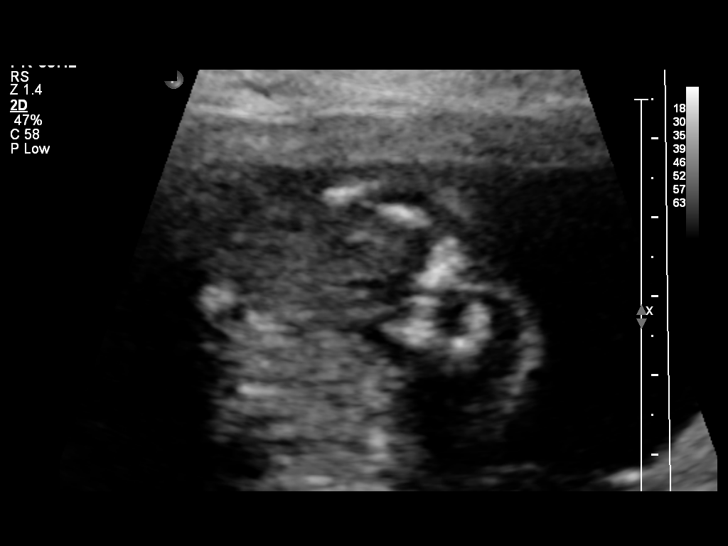
[im 46/82]
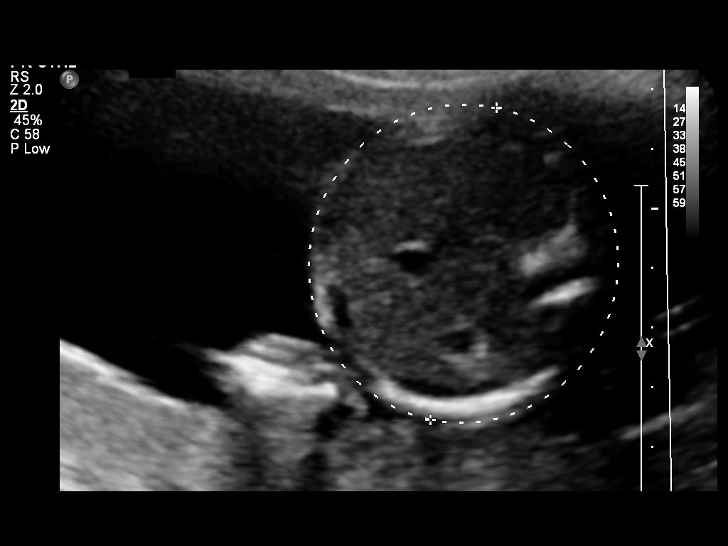
[im 52/82]
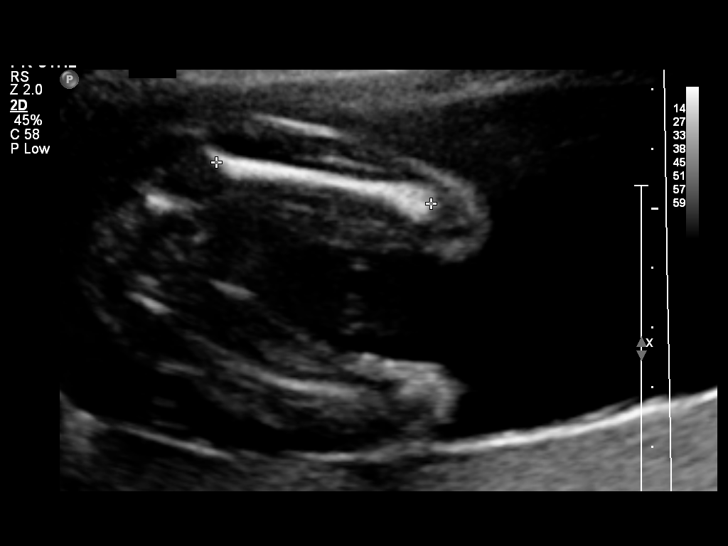
[im 58/82]
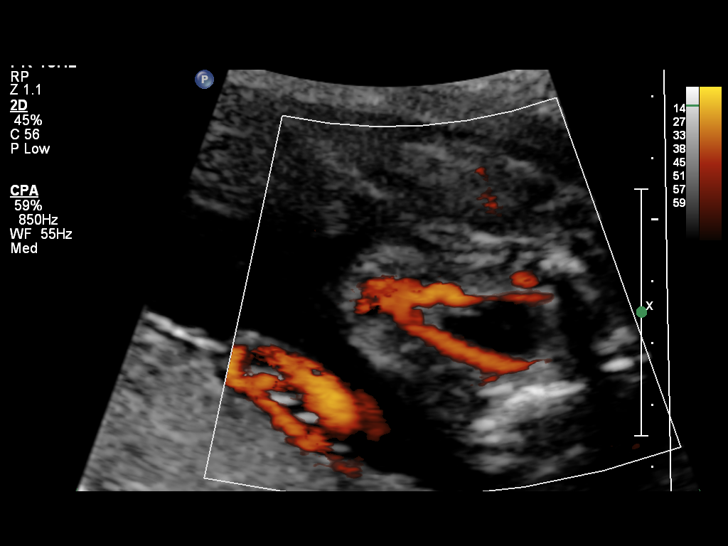
[im 67/82]
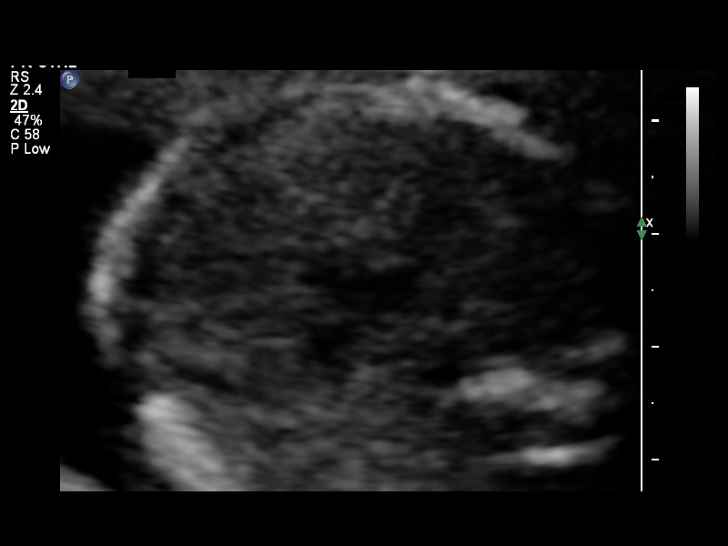
[im 73/82]
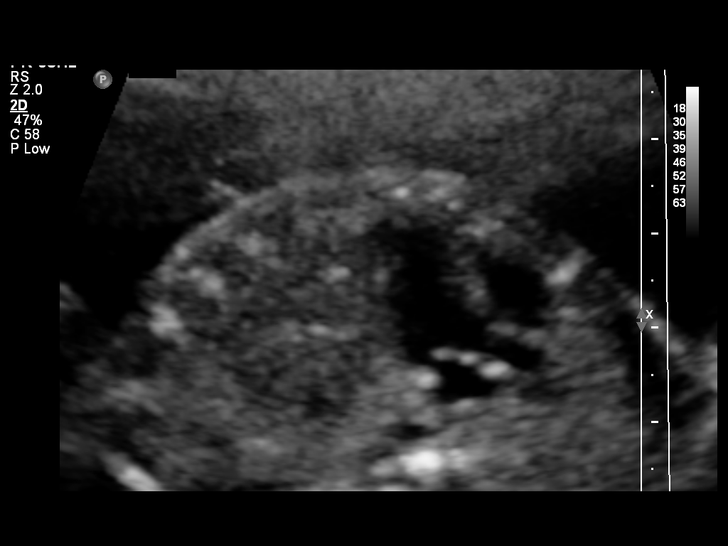
[im 79/82]
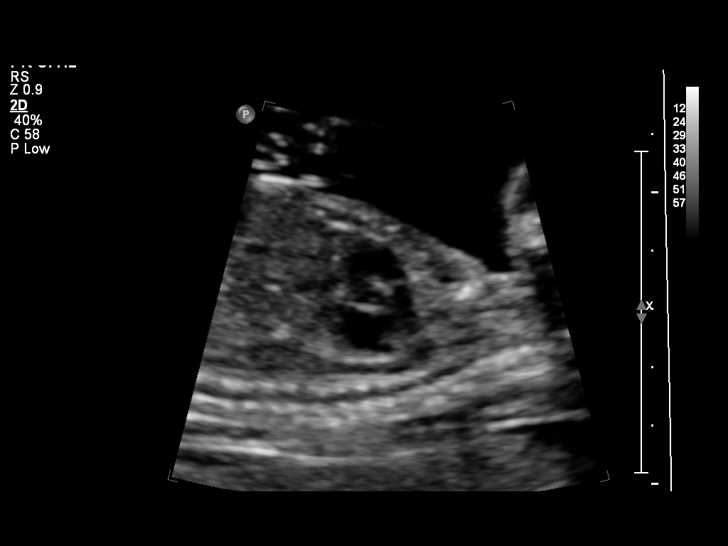

[12 of 28 positions shown; findings below may reference images not displayed]

FINDINGS: 1. Single intrauterine pregnancy.
 2. Fetal biometry is consistent with dating.
 3. Posterior placenta without evidence of previa.
 4. Normal amniotic fluid volume.
 5. Normal transabdominal cervical length.
 6. The views of the heart are limited.
 7. The remainder of the limited anatomy survey is normal.
Recommendations

 1. Appropriate fetal growth.
 2. Limited anatomy survey:
 - recommend follow up in 3-4 weeks to complete anatomic
 survey

## 2014-05-27 ENCOUNTER — Encounter: Payer: Self-pay | Admitting: Obstetrics & Gynecology

## 2014-05-27 DIAGNOSIS — O09899 Supervision of other high risk pregnancies, unspecified trimester: Secondary | ICD-10-CM | POA: Insufficient documentation

## 2014-05-27 DIAGNOSIS — Z283 Underimmunization status: Secondary | ICD-10-CM | POA: Insufficient documentation

## 2014-05-27 DIAGNOSIS — O9989 Other specified diseases and conditions complicating pregnancy, childbirth and the puerperium: Secondary | ICD-10-CM

## 2014-05-30 ENCOUNTER — Ambulatory Visit (INDEPENDENT_AMBULATORY_CARE_PROVIDER_SITE_OTHER): Payer: BC Managed Care – PPO | Admitting: Family Medicine

## 2014-05-30 ENCOUNTER — Encounter: Payer: Self-pay | Admitting: Family Medicine

## 2014-05-30 VITALS — BP 119/69 | HR 95 | Temp 98.7°F | Wt 185.9 lb

## 2014-05-30 DIAGNOSIS — F1290 Cannabis use, unspecified, uncomplicated: Secondary | ICD-10-CM | POA: Insufficient documentation

## 2014-05-30 DIAGNOSIS — O3421 Maternal care for scar from previous cesarean delivery: Secondary | ICD-10-CM

## 2014-05-30 DIAGNOSIS — F1210 Cannabis abuse, uncomplicated: Secondary | ICD-10-CM

## 2014-05-30 DIAGNOSIS — Z3492 Encounter for supervision of normal pregnancy, unspecified, second trimester: Secondary | ICD-10-CM

## 2014-05-30 DIAGNOSIS — Z3490 Encounter for supervision of normal pregnancy, unspecified, unspecified trimester: Secondary | ICD-10-CM | POA: Insufficient documentation

## 2014-05-30 DIAGNOSIS — Z3480 Encounter for supervision of other normal pregnancy, unspecified trimester: Secondary | ICD-10-CM

## 2014-05-30 DIAGNOSIS — O34219 Maternal care for unspecified type scar from previous cesarean delivery: Secondary | ICD-10-CM | POA: Insufficient documentation

## 2014-05-30 LAB — POCT URINALYSIS DIP (DEVICE)
BILIRUBIN URINE: NEGATIVE
GLUCOSE, UA: NEGATIVE mg/dL
Hgb urine dipstick: NEGATIVE
Ketones, ur: NEGATIVE mg/dL
LEUKOCYTES UA: NEGATIVE
Nitrite: NEGATIVE
Protein, ur: NEGATIVE mg/dL
Specific Gravity, Urine: 1.015 (ref 1.005–1.030)
UROBILINOGEN UA: 1 mg/dL (ref 0.0–1.0)
pH: 7 (ref 5.0–8.0)

## 2014-05-30 NOTE — Patient Instructions (Signed)
Offices that do circumcisions:  [facility] [phone] [facility]([place]) $317.20 within 4 weeks of birth [facility] [phone] [facility]([place]) $250 within 7 days of birth [facility] [phone] [facility]([place]) $175 within 2 weeks of birth

## 2014-05-30 NOTE — Progress Notes (Signed)
  Subjective:    Christine Cooke is a [name] 2113w6d being seen today for her first obstetrical visit.  Her obstetrical history is significant for hx of 3 previous LTCS. Patient does intend to breast feed. Pregnancy history fully reviewed.  Patient reports no complaints.  Reports 1st c/s was due to "broke my water too soon," dilated to 4 for "hours"  Birth control: desires BTL  Filed Vitals:   05/30/14 1031  BP: 119/69  Pulse: 95  Temp: 98.7 F (37.1 C)  Weight: 185 lb 14.4 oz (84.324 kg)    HISTORY: OB History  Gravida Para Term Preterm AB SAB TAB Ectopic Multiple Living  5 3 3  1  1   3     # Outcome Date GA Lbr Len/2nd Weight Sex Delivery Anes PTL Lv  5 CUR           4 TRM 07/03/12    M LTCS Spinal  Y  3 TRM 05/01/09    F LTCS Spinal  Y  2 TRM 10/29/06    F LTCS EPI  Y  1 TAB 2005             Past Medical History  Diagnosis Date  . Ovarian cyst    Past Surgical History  Procedure Laterality Date  . Cesarean section    . Induced abortion     Family History  Problem Relation Age of Onset  . Hypertension Mother   . Sickle cell anemia Sister   . Diabetes Maternal Aunt   . Hypertension Maternal Aunt   . Stroke Maternal Grandmother   . Asthma Maternal Grandmother   . Hypertension Maternal Grandmother   . Hyperlipidemia Maternal Grandmother   . Heart disease Maternal Grandmother      Exam    Uterus:     Pelvic Exam:    Perineum: No Hemorrhoids, Normal Perineum   Vulva: normal   Vagina:  normal mucosa   pH: Not performed   Cervix: retroverted   Adnexa: not evaluated   Bony Pelvis: Not evaluated  System: Breast:  normal appearance, no masses or tenderness   Skin: normal coloration and turgor, no rashes    Neurologic: oriented, normal, normal mood   Extremities: normal strength, tone, and muscle mass   HEENT PERRLA and extra ocular movement intact   Mouth/Teeth mucous membranes moist, pharynx normal without lesions   Neck supple and no goiter   Cardiovascular: Regular rate   Respiratory:  appears well, vitals normal, no respiratory distress, acyanotic, normal RR, ear and throat exam is normal, neck free of mass or lymphadenopathy   Abdomen: soft, nontender, no masses   Urinary: urethral meatus normal      Assessment:    Pregnancy: [name] Patient Active Problem List   Diagnosis Date Noted  . Supervision of normal pregnancy 05/30/2014  . Rubella non-immune status, antepartum 05/27/2014        Plan:     Initial labs drawn. Prenatal vitamins. Problem list reviewed and updated. Genetic Screening discussed - too late  Ultrasound discussed; fetal survey: results reviewed.  Follow up in 4 weeks. 50% of 25 min visit spent on counseling and coordination of care.  Bacteriuria: has not start abx yet, advised to pick them up and start them UDS: +THC, cessation advised  Christine Cooke 05/30/2014

## 2014-05-30 NOTE — Progress Notes (Signed)
Initial OB visit, new OB education material given

## 2014-05-31 LAB — GLUCOSE TOLERANCE, 1 HOUR (50G) W/O FASTING: Glucose, 1 Hour GTT: 114 mg/dL (ref 70–140)

## 2014-06-04 LAB — CYTOLOGY - PAP

## 2014-06-17 ENCOUNTER — Encounter: Payer: Self-pay | Admitting: Obstetrics & Gynecology

## 2014-06-26 ENCOUNTER — Ambulatory Visit (INDEPENDENT_AMBULATORY_CARE_PROVIDER_SITE_OTHER): Payer: Medicaid Other | Admitting: Physician Assistant

## 2014-06-26 ENCOUNTER — Encounter: Payer: Self-pay | Admitting: Physician Assistant

## 2014-06-26 VITALS — BP 112/71 | HR 101 | Wt 183.6 lb

## 2014-06-26 DIAGNOSIS — Z3480 Encounter for supervision of other normal pregnancy, unspecified trimester: Secondary | ICD-10-CM

## 2014-06-26 DIAGNOSIS — Z23 Encounter for immunization: Secondary | ICD-10-CM

## 2014-06-26 DIAGNOSIS — Z3492 Encounter for supervision of normal pregnancy, unspecified, second trimester: Secondary | ICD-10-CM

## 2014-06-26 LAB — POCT URINALYSIS DIP (DEVICE)
Glucose, UA: NEGATIVE mg/dL
KETONES UR: 40 mg/dL — AB
LEUKOCYTES UA: NEGATIVE
Nitrite: NEGATIVE
Protein, ur: 30 mg/dL — AB
Specific Gravity, Urine: 1.02 (ref 1.005–1.030)
Urobilinogen, UA: 2 mg/dL — ABNORMAL HIGH (ref 0.0–1.0)
pH: 6.5 (ref 5.0–8.0)

## 2014-06-26 NOTE — Progress Notes (Signed)
Needs rx for PNV 

## 2014-06-26 NOTE — Patient Instructions (Signed)
Breastfeeding Deciding to breastfeed is one of the best choices you can make for you and your baby. A change in hormones during pregnancy causes your breast tissue to grow and increases the number and size of your milk ducts. These hormones also allow proteins, sugars, and fats from your blood supply to make breast milk in your milk-producing glands. Hormones prevent breast milk from being released before your baby is born as well as prompt milk flow after birth. Once breastfeeding has begun, thoughts of your baby, as well as his or her sucking or crying, can stimulate the release of milk from your milk-producing glands.  BENEFITS OF BREASTFEEDING For Your Baby  Your first milk (colostrum) helps your baby's digestive system function better.   There are antibodies in your milk that help your baby fight off infections.   Your baby has a lower incidence of asthma, allergies, and sudden infant death syndrome.   The nutrients in breast milk are better for your baby than infant formulas and are designed uniquely for your baby's needs.   Breast milk improves your baby's brain development.   Your baby is less likely to develop other conditions, such as childhood obesity, asthma, or type 2 diabetes mellitus.  For You   Breastfeeding helps to create a very special bond between you and your baby.   Breastfeeding is convenient. Breast milk is always available at the correct temperature and costs nothing.   Breastfeeding helps to burn calories and helps you lose the weight gained during pregnancy.   Breastfeeding makes your uterus contract to its prepregnancy size faster and slows bleeding (lochia) after you give birth.   Breastfeeding helps to lower your risk of developing type 2 diabetes mellitus, osteoporosis, and breast or ovarian cancer later in life. SIGNS THAT YOUR BABY IS HUNGRY Early Signs of Hunger  Increased alertness or activity.  Stretching.  Movement of the head from  side to side.  Movement of the head and opening of the mouth when the corner of the mouth or cheek is stroked (rooting).  Increased sucking sounds, smacking lips, cooing, sighing, or squeaking.  Hand-to-mouth movements.  Increased sucking of fingers or hands. Late Signs of Hunger  Fussing.  Intermittent crying. Extreme Signs of Hunger Signs of extreme hunger will require calming and consoling before your baby will be able to breastfeed successfully. Do not wait for the following signs of extreme hunger to occur before you initiate breastfeeding:   Restlessness.  A loud, strong cry.   Screaming. BREASTFEEDING BASICS Breastfeeding Initiation  Find a comfortable place to sit or lie down, with your neck and back well supported.  Place a pillow or rolled up blanket under your baby to bring him or her to the level of your breast (if you are seated). Nursing pillows are specially designed to help support your arms and your baby while you breastfeed.  Make sure that your baby's abdomen is facing your abdomen.   Gently massage your breast. With your fingertips, massage from your chest wall toward your nipple in a circular motion. This encourages milk flow. You may need to continue this action during the feeding if your milk flows slowly.  Support your breast with 4 fingers underneath and your thumb above your nipple. Make sure your fingers are well away from your nipple and your baby's mouth.   Stroke your baby's lips gently with your finger or nipple.   When your baby's mouth is open wide enough, quickly bring your baby to your   breast, placing your entire nipple and as much of the colored area around your nipple (areola) as possible into your baby's mouth.   More areola should be visible above your baby's upper lip than below the lower lip.   Your baby's tongue should be between his or her lower gum and your breast.   Ensure that your baby's mouth is correctly positioned  around your nipple (latched). Your baby's lips should create a seal on your breast and be turned out (everted).  It is common for your baby to suck about 2-3 minutes in order to start the flow of breast milk. Latching Teaching your baby how to latch on to your breast properly is very important. An improper latch can cause nipple pain and decreased milk supply for you and poor weight gain in your baby. Also, if your baby is not latched onto your nipple properly, he or she may swallow some air during feeding. This can make your baby fussy. Burping your baby when you switch breasts during the feeding can help to get rid of the air. However, teaching your baby to latch on properly is still the best way to prevent fussiness from swallowing air while breastfeeding. Signs that your baby has successfully latched on to your nipple:    Silent tugging or silent sucking, without causing you pain.   Swallowing heard between every 3-4 sucks.    Muscle movement above and in front of his or her ears while sucking.  Signs that your baby has not successfully latched on to nipple:   Sucking sounds or smacking sounds from your baby while breastfeeding.  Nipple pain. If you think your baby has not latched on correctly, slip your finger into the corner of your baby's mouth to break the suction and place it between your baby's gums. Attempt breastfeeding initiation again. Signs of Successful Breastfeeding Signs from your baby:   A gradual decrease in the number of sucks or complete cessation of sucking.   Falling asleep.   Relaxation of his or her body.   Retention of a small amount of milk in his or her mouth.   Letting go of your breast by himself or herself. Signs from you:  Breasts that have increased in firmness, weight, and size 1-3 hours after feeding.   Breasts that are softer immediately after breastfeeding.  Increased milk volume, as well as a change in milk consistency and color by  the fifth day of breastfeeding.   Nipples that are not sore, cracked, or bleeding. Signs That Your Baby is Getting Enough Milk  Wetting at least 3 diapers in a 24-hour period. The urine should be clear and pale yellow by age 5 days.  At least 3 stools in a 24-hour period by age 5 days. The stool should be soft and yellow.  At least 3 stools in a 24-hour period by age 7 days. The stool should be seedy and yellow.  No loss of weight greater than 10% of birth weight during the first 3 days of age.  Average weight gain of 4-7 ounces (113-198 g) per week after age 4 days.  Consistent daily weight gain by age 5 days, without weight loss after the age of 2 weeks. After a feeding, your baby may spit up a small amount. This is common. BREASTFEEDING FREQUENCY AND DURATION Frequent feeding will help you make more milk and can prevent sore nipples and breast engorgement. Breastfeed when you feel the need to reduce the fullness of your breasts   or when your baby shows signs of hunger. This is called "breastfeeding on demand." Avoid introducing a pacifier to your baby while you are working to establish breastfeeding (the first 4-6 weeks after your baby is born). After this time you may choose to use a pacifier. Research has shown that pacifier use during the first year of a baby's life decreases the risk of sudden infant death syndrome (SIDS). Allow your baby to feed on each breast as long as he or she wants. Breastfeed until your baby is finished feeding. When your baby unlatches or falls asleep while feeding from the first breast, offer the second breast. Because newborns are often sleepy in the first few weeks of life, you may need to awaken your baby to get him or her to feed. Breastfeeding times will vary from baby to baby. However, the following rules can serve as a guide to help you ensure that your baby is properly fed:  Newborns (babies 4 weeks of age or younger) may breastfeed every 1-3  hours.  Newborns should not go longer than 3 hours during the day or 5 hours during the night without breastfeeding.  You should breastfeed your baby a minimum of 8 times in a 24-hour period until you begin to introduce solid foods to your baby at around 6 months of age. BREAST MILK PUMPING Pumping and storing breast milk allows you to ensure that your baby is exclusively fed your breast milk, even at times when you are unable to breastfeed. This is especially important if you are going back to work while you are still breastfeeding or when you are not able to be present during feedings. Your lactation consultant can give you guidelines on how long it is safe to store breast milk.  A breast pump is a machine that allows you to pump milk from your breast into a sterile bottle. The pumped breast milk can then be stored in a refrigerator or freezer. Some breast pumps are operated by hand, while others use electricity. Ask your lactation consultant which type will work best for you. Breast pumps can be purchased, but some hospitals and breastfeeding support groups lease breast pumps on a monthly basis. A lactation consultant can teach you how to hand express breast milk, if you prefer not to use a pump.  CARING FOR YOUR BREASTS WHILE YOU BREASTFEED Nipples can become dry, cracked, and sore while breastfeeding. The following recommendations can help keep your breasts moisturized and healthy:  Avoid using soap on your nipples.   Wear a supportive bra. Although not required, special nursing bras and tank tops are designed to allow access to your breasts for breastfeeding without taking off your entire bra or top. Avoid wearing underwire-style bras or extremely tight bras.  Air dry your nipples for 3-4minutes after each feeding.   Use only cotton bra pads to absorb leaked breast milk. Leaking of breast milk between feedings is normal.   Use lanolin on your nipples after breastfeeding. Lanolin helps to  maintain your skin's normal moisture barrier. If you use pure lanolin, you do not need to wash it off before feeding your baby again. Pure lanolin is not toxic to your baby. You may also hand express a few drops of breast milk and gently massage that milk into your nipples and allow the milk to air dry. In the first few weeks after giving birth, some women experience extremely full breasts (engorgement). Engorgement can make your breasts feel heavy, warm, and tender to the   touch. Engorgement peaks within 3-5 days after you give birth. The following recommendations can help ease engorgement:  Completely empty your breasts while breastfeeding or pumping. You may want to start by applying warm, moist heat (in the shower or with warm water-soaked hand towels) just before feeding or pumping. This increases circulation and helps the milk flow. If your baby does not completely empty your breasts while breastfeeding, pump any extra milk after he or she is finished.  Wear a snug bra (nursing or regular) or tank top for 1-2 days to signal your body to slightly decrease milk production.  Apply ice packs to your breasts, unless this is too uncomfortable for you.  Make sure that your baby is latched on and positioned properly while breastfeeding. If engorgement persists after 48 hours of following these recommendations, contact your health care provider or a lactation consultant. OVERALL HEALTH CARE RECOMMENDATIONS WHILE BREASTFEEDING  Eat healthy foods. Alternate between meals and snacks, eating 3 of each per day. Because what you eat affects your breast milk, some of the foods may make your baby more irritable than usual. Avoid eating these foods if you are sure that they are negatively affecting your baby.  Drink milk, fruit juice, and water to satisfy your thirst (about 10 glasses a day).   Rest often, relax, and continue to take your prenatal vitamins to prevent fatigue, stress, and anemia.  Continue  breast self-awareness checks.  Avoid chewing and smoking tobacco.  Avoid alcohol and drug use. Some medicines that may be harmful to your baby can pass through breast milk. It is important to ask your health care provider before taking any medicine, including all over-the-counter and prescription medicine as well as vitamin and herbal supplements. It is possible to become pregnant while breastfeeding. If birth control is desired, ask your health care provider about options that will be safe for your baby. SEEK MEDICAL CARE IF:   You feel like you want to stop breastfeeding or have become frustrated with breastfeeding.  You have painful breasts or nipples.  Your nipples are cracked or bleeding.  Your breasts are red, tender, or warm.  You have a swollen area on either breast.  You have a fever or chills.  You have nausea or vomiting.  You have drainage other than breast milk from your nipples.  Your breasts do not become full before feedings by the fifth day after you give birth.  You feel sad and depressed.  Your baby is too sleepy to eat well.  Your baby is having trouble sleeping.   Your baby is wetting less than 3 diapers in a 24-hour period.  Your baby has less than 3 stools in a 24-hour period.  Your baby's skin or the white part of his or her eyes becomes yellow.   Your baby is not gaining weight by 5 days of age. SEEK IMMEDIATE MEDICAL CARE IF:   Your baby is overly tired (lethargic) and does not want to wake up and feed.  Your baby develops an unexplained fever. Document Released: 10/04/2005 Document Revised: 10/09/2013 Document Reviewed: 03/28/2013 [employer] Patient Information 2015 [employer]. This information is not intended to replace advice given to you by your health care provider. Make sure you discuss any questions you have with your health care provider. Second Trimester of Pregnancy The second trimester is from week 13 through week 28, months 4  through 6. The second trimester is often a time when you feel your best. Your body has also   adjusted to being pregnant, and you begin to feel better physically. Usually, morning sickness has lessened or quit completely, you may have more energy, and you may have an increase in appetite. The second trimester is also a time when the fetus is growing rapidly. At the end of the sixth month, the fetus is about 9 inches long and weighs about 1 pounds. You will likely begin to feel the baby move (quickening) between 18 and 20 weeks of the pregnancy. BODY CHANGES Your body goes through many changes during pregnancy. The changes vary from woman to woman.   Your weight will continue to increase. You will notice your lower abdomen bulging out.  You may begin to get stretch marks on your hips, abdomen, and breasts.  You may develop headaches that can be relieved by medicines approved by your health care provider.  You may urinate more often because the fetus is pressing on your bladder.  You may develop or continue to have heartburn as a result of your pregnancy.  You may develop constipation because certain hormones are causing the muscles that push waste through your intestines to slow down.  You may develop hemorrhoids or swollen, bulging veins (varicose veins).  You may have back pain because of the weight gain and pregnancy hormones relaxing your joints between the bones in your pelvis and as a result of a shift in weight and the muscles that support your balance.  Your breasts will continue to grow and be tender.  Your gums may bleed and may be sensitive to brushing and flossing.  Dark spots or blotches (chloasma, mask of pregnancy) may develop on your face. This will likely fade after the baby is born.  A dark line from your belly button to the pubic area (linea nigra) may appear. This will likely fade after the baby is born.  You may have changes in your hair. These can include thickening of  your hair, rapid growth, and changes in texture. Some women also have hair loss during or after pregnancy, or hair that feels dry or thin. Your hair will most likely return to normal after your baby is born. WHAT TO EXPECT AT YOUR PRENATAL VISITS During a routine prenatal visit:  You will be weighed to make sure you and the fetus are growing normally.  Your blood pressure will be taken.  Your abdomen will be measured to track your baby's growth.  The fetal heartbeat will be listened to.  Any test results from the previous visit will be discussed. Your health care provider may ask you:  How you are feeling.  If you are feeling the baby move.  If you have had any abnormal symptoms, such as leaking fluid, bleeding, severe headaches, or abdominal cramping.  If you have any questions. Other tests that may be performed during your second trimester include:  Blood tests that check for:  Low iron levels (anemia).  Gestational diabetes (between 24 and 28 weeks).  Rh antibodies.  Urine tests to check for infections, diabetes, or protein in the urine.  An ultrasound to confirm the proper growth and development of the baby.  An amniocentesis to check for possible genetic problems.  Fetal screens for spina bifida and Down syndrome. HOME CARE INSTRUCTIONS   Avoid all smoking, herbs, alcohol, and unprescribed drugs. These chemicals affect the formation and growth of the baby.  Follow your health care provider's instructions regarding medicine use. There are medicines that are either safe or unsafe to take during pregnancy.    Exercise only as directed by your health care provider. Experiencing uterine cramps is a good sign to stop exercising.  Continue to eat regular, healthy meals.  Wear a good support bra for breast tenderness.  Do not use hot tubs, steam rooms, or saunas.  Wear your seat belt at all times when driving.  Avoid raw meat, uncooked cheese, cat litter boxes, and  soil used by cats. These carry germs that can cause birth defects in the baby.  Take your prenatal vitamins.  Try taking a stool softener (if your health care provider approves) if you develop constipation. Eat more high-fiber foods, such as fresh vegetables or fruit and whole grains. Drink plenty of fluids to keep your urine clear or pale yellow.  Take warm sitz baths to soothe any pain or discomfort caused by hemorrhoids. Use hemorrhoid cream if your health care provider approves.  If you develop varicose veins, wear support hose. Elevate your feet for 15 minutes, 3-4 times a day. Limit salt in your diet.  Avoid heavy lifting, wear low heel shoes, and practice good posture.  Rest with your legs elevated if you have leg cramps or low back pain.  Visit your dentist if you have not gone yet during your pregnancy. Use a soft toothbrush to brush your teeth and be gentle when you floss.  A sexual relationship may be continued unless your health care provider directs you otherwise.  Continue to go to all your prenatal visits as directed by your health care provider. SEEK MEDICAL CARE IF:   You have dizziness.  You have mild pelvic cramps, pelvic pressure, or nagging pain in the abdominal area.  You have persistent nausea, vomiting, or diarrhea.  You have a bad smelling vaginal discharge.  You have pain with urination. SEEK IMMEDIATE MEDICAL CARE IF:   You have a fever.  You are leaking fluid from your vagina.  You have spotting or bleeding from your vagina.  You have severe abdominal cramping or pain.  You have rapid weight gain or loss.  You have shortness of breath with chest pain.  You notice sudden or extreme swelling of your face, hands, ankles, feet, or legs.  You have not felt your baby move in over an hour.  You have severe headaches that do not go away with medicine.  You have vision changes. Document Released: 09/28/2001 Document Revised: 10/09/2013 Document  Reviewed: 12/05/2012 [employer] Patient Information 2015 [employer]. This information is not intended to replace advice given to you by your health care provider. Make sure you discuss any questions you have with your health care provider.  

## 2014-06-26 NOTE — Progress Notes (Signed)
25 weeks, stable.  Pt complaining of back pain - worse at night.  Uses many pillows to sleep Encouraged maternity support belt Diet discussed.  Encouraged 3-5 meals/day.  Eat even if no appetite to avoid further weight loss. She is congratulated for smoking cessation (THC) Flu today GTT/Tdap tomorrow RTC 3 weeks

## 2014-07-03 ENCOUNTER — Telehealth: Payer: Self-pay | Admitting: *Deleted

## 2014-07-03 ENCOUNTER — Encounter: Payer: Self-pay | Admitting: *Deleted

## 2014-07-03 NOTE — Telephone Encounter (Signed)
Patient dropped off a request for accomodation form for disabled patients. We are unable to complete this form as pregnancy is not a disability. Called patient and phone is not in service. Letter with pregnancy restrictions sent to patient.

## 2014-07-17 ENCOUNTER — Ambulatory Visit (INDEPENDENT_AMBULATORY_CARE_PROVIDER_SITE_OTHER): Payer: BC Managed Care – PPO | Admitting: Advanced Practice Midwife

## 2014-07-17 ENCOUNTER — Encounter: Payer: Self-pay | Admitting: Obstetrics & Gynecology

## 2014-07-17 DIAGNOSIS — Z23 Encounter for immunization: Secondary | ICD-10-CM | POA: Diagnosis not present

## 2014-07-17 DIAGNOSIS — Z3493 Encounter for supervision of normal pregnancy, unspecified, third trimester: Secondary | ICD-10-CM

## 2014-07-17 DIAGNOSIS — Z3492 Encounter for supervision of normal pregnancy, unspecified, second trimester: Secondary | ICD-10-CM

## 2014-07-17 DIAGNOSIS — Z3480 Encounter for supervision of other normal pregnancy, unspecified trimester: Secondary | ICD-10-CM

## 2014-07-17 LAB — POCT URINALYSIS DIP (DEVICE)
BILIRUBIN URINE: NEGATIVE
Glucose, UA: NEGATIVE mg/dL
Hgb urine dipstick: NEGATIVE
KETONES UR: NEGATIVE mg/dL
LEUKOCYTES UA: NEGATIVE
Nitrite: NEGATIVE
PROTEIN: NEGATIVE mg/dL
SPECIFIC GRAVITY, URINE: 1.015 (ref 1.005–1.030)
Urobilinogen, UA: 1 mg/dL (ref 0.0–1.0)
pH: 6.5 (ref 5.0–8.0)

## 2014-07-17 MED ORDER — CONCEPT OB 130-92.4-1 MG PO CAPS: 1.0000 | ORAL_CAPSULE | Freq: Every day | ORAL | Status: DC

## 2014-07-17 MED ORDER — TETANUS-DIPHTH-ACELL PERTUSSIS 5-2.5-18.5 LF-MCG/0.5 IM SUSP: 0.5000 mL | Freq: Once | INTRAMUSCULAR | Status: DC

## 2014-07-17 NOTE — Progress Notes (Signed)
Occasional contractions and pressure, but not more than 5 per hour. Declines VE. 28 weeks lab. TDaP.

## 2014-07-17 NOTE — Patient Instructions (Signed)
Tdap Vaccine (Tetanus, Diphtheria, Pertussis): What You Need to Know 1. Why get vaccinated? Tetanus, diphtheria and pertussis can be very serious diseases, even for adolescents and adults. Tdap vaccine can protect us from these diseases. TETANUS (Lockjaw) causes painful muscle tightening and stiffness, usually all over the body.  It can lead to tightening of muscles in the head and neck so you can't open your mouth, swallow, or sometimes even breathe. Tetanus kills about 1 out of 5 people who are infected. DIPHTHERIA can cause a thick coating to form in the back of the throat.  It can lead to breathing problems, paralysis, heart failure, and death. PERTUSSIS (Whooping Cough) causes severe coughing spells, which can cause difficulty breathing, vomiting and disturbed sleep.  It can also lead to weight loss, incontinence, and rib fractures. Up to 2 in 100 adolescents and 5 in 100 adults with pertussis are hospitalized or have complications, which could include pneumonia or death. These diseases are caused by bacteria. Diphtheria and pertussis are spread from person to person through coughing or sneezing. Tetanus enters the body through cuts, scratches, or wounds. Before vaccines, the [place] saw as many as 200,000 cases a year of diphtheria and pertussis, and hundreds of cases of tetanus. Since vaccination began, tetanus and diphtheria have dropped by about 99% and pertussis by about 80%. 2. Tdap vaccine Tdap vaccine can protect adolescents and adults from tetanus, diphtheria, and pertussis. One dose of Tdap is routinely given at age 11 or 12. People who did not get Tdap at that age should get it as soon as possible. Tdap is especially important for health care professionals and anyone having close contact with a baby younger than 12 months. Pregnant women should get a dose of Tdap during every pregnancy, to protect the newborn from pertussis. Infants are most at risk for severe, life-threatening  complications from pertussis. A similar vaccine, called Td, protects from tetanus and diphtheria, but not pertussis. A Td booster should be given every 10 years. Tdap may be given as one of these boosters if you have not already gotten a dose. Tdap may also be given after a severe cut or burn to prevent tetanus infection. Your doctor can give you more information. Tdap may safely be given at the same time as other vaccines. 3. Some people should not get this vaccine  If you ever had a life-threatening allergic reaction after a dose of any tetanus, diphtheria, or pertussis containing vaccine, OR if you have a severe allergy to any part of this vaccine, you should not get Tdap. Tell your doctor if you have any severe allergies.  If you had a coma, or long or multiple seizures within 7 days after a childhood dose of DTP or DTaP, you should not get Tdap, unless a cause other than the vaccine was found. You can still get Td.  Talk to your doctor if you:  have epilepsy or another nervous system problem,  had severe pain or swelling after any vaccine containing diphtheria, tetanus or pertussis,  ever had Guillain-Barr Syndrome (GBS),  aren't feeling well on the day the shot is scheduled. 4. Risks of a vaccine reaction With any medicine, including vaccines, there is a chance of side effects. These are usually mild and go away on their own, but serious reactions are also possible. Brief fainting spells can follow a vaccination, leading to injuries from falling. Sitting or lying down for about 15 minutes can help prevent these. Tell your doctor if you feel   dizzy or light-headed, or have vision changes or ringing in the ears. Mild problems following Tdap (Did not interfere with activities)  Pain where the shot was given (about 3 in 4 adolescents or 2 in 3 adults)  Redness or swelling where the shot was given (about 1 person in 5)  Mild fever of at least 100.66F (up to about 1 in 25 adolescents or  1 in 100 adults)  Headache (about 3 or 4 people in 10)  Tiredness (about 1 person in 3 or 4)  Nausea, vomiting, diarrhea, stomach ache (up to 1 in 4 adolescents or 1 in 10 adults)  Chills, body aches, sore joints, rash, swollen glands (uncommon) Moderate problems following Tdap (Interfered with activities, but did not require medical attention)  Pain where the shot was given (about 1 in 5 adolescents or 1 in 100 adults)  Redness or swelling where the shot was given (up to about 1 in 16 adolescents or 1 in 25 adults)  Fever over 102F (about 1 in 100 adolescents or 1 in 250 adults)  Headache (about 3 in 20 adolescents or 1 in 10 adults)  Nausea, vomiting, diarrhea, stomach ache (up to 1 or 3 people in 100)  Swelling of the entire arm where the shot was given (up to about 3 in 100). Severe problems following Tdap (Unable to perform usual activities; required medical attention)  Swelling, severe pain, bleeding and redness in the arm where the shot was given (rare). A severe allergic reaction could occur after any vaccine (estimated less than 1 in a million doses). 5. What if there is a serious reaction? What should I look for?  Look for anything that concerns you, such as signs of a severe allergic reaction, very high fever, or behavior changes. Signs of a severe allergic reaction can include hives, swelling of the face and throat, difficulty breathing, a fast heartbeat, dizziness, and weakness. These would start a few minutes to a few hours after the vaccination. What should I do?  If you think it is a severe allergic reaction or other emergency that can't wait, call 9-1-1 or get the person to the nearest hospital. Otherwise, call your doctor.  Afterward, the reaction should be reported to the "Vaccine Adverse Event Reporting System" (VAERS). Your doctor might file this report, or you can do it yourself through the VAERS web site at www.vaers.LAgents.nohhs.gov, or by calling  1-(520)302-1620. VAERS is only for reporting reactions. They do not give medical advice.  6. The National Vaccine Injury Compensation Program The [employer] Vaccine Injury Compensation Program (VICP) is a federal program that was created to compensate people who may have been injured by certain vaccines. Persons who believe they may have been injured by a vaccine can learn about the program and about filing a claim by calling 1-(416)007-8291 or visiting the VICP website at SpiritualWord.atwww.hrsa.gov/vaccinecompensation. 7. How can I learn more?  Ask your doctor.  Call your local or state health department.  Contact the Centers for Disease Control and Prevention (CDC):  Call (580)846-77801-(628)155-6906 or visit CDC's website at PicCapture.uywww.cdc.gov/vaccines. CDC Tdap Vaccine VIS (02/24/12) Document Released: 04/04/2012 Document Revised: 02/18/2014 Document Reviewed: 01/16/2014 ExitCare Patient Information 2015 [place]. This information is not intended to replace advice given to you by your health care provider. Make sure you discuss any questions you have with your health care provider.  Preterm Labor Information Preterm labor is when labor starts at less than 37 weeks of pregnancy. The normal length of a pregnancy is 6939  to 41 weeks. CAUSES Often, there is no identifiable underlying cause as to why a woman goes into preterm labor. One of the most common known causes of preterm labor is infection. Infections of the uterus, cervix, vagina, amniotic sac, bladder, kidney, or even the lungs (pneumonia) can cause labor to start. Other suspected causes of preterm labor include:   Urogenital infections, such as yeast infections and bacterial vaginosis.   Uterine abnormalities (uterine shape, uterine septum, fibroids, or bleeding from the placenta).   A cervix that has been operated on (it may fail to stay closed).   Malformations in the fetus.   Multiple gestations (twins, triplets, and so on).   Breakage of the  amniotic sac.  RISK FACTORS  Having a previous history of preterm labor.   Having premature rupture of membranes (PROM).   Having a placenta that covers the opening of the cervix (placenta previa).   Having a placenta that separates from the uterus (placental abruption).   Having a cervix that is too weak to hold the fetus in the uterus (incompetent cervix).   Having too much fluid in the amniotic sac (polyhydramnios).   Taking illegal drugs or smoking while pregnant.   Not gaining enough weight while pregnant.   Being younger than 5418 and older than 28 years old.   Having a low socioeconomic status.   Being African American. SYMPTOMS Signs and symptoms of preterm labor include:   Menstrual-like cramps, abdominal pain, or back pain.  Uterine contractions that are regular, as frequent as six in an hour, regardless of their intensity (may be mild or painful).  Contractions that start on the top of the uterus and spread down to the lower abdomen and back.   A sense of increased pelvic pressure.   A watery or bloody mucus discharge that comes from the vagina.  TREATMENT Depending on the length of the pregnancy and other circumstances, your health care provider may suggest bed rest. If necessary, there are medicines that can be given to stop contractions and to mature the fetal lungs. If labor happens before 34 weeks of pregnancy, a prolonged hospital stay may be recommended. Treatment depends on the condition of both you and the fetus.  WHAT SHOULD YOU DO IF YOU THINK YOU ARE IN PRETERM LABOR? Call your health care provider right away. You will need to go to the hospital to get checked immediately. HOW CAN YOU PREVENT PRETERM LABOR IN FUTURE PREGNANCIES? You should:   Stop smoking if you smoke.  Maintain healthy weight gain and avoid chemicals and drugs that are not necessary.  Be watchful for any type of infection.  Inform your health care provider if you  have a known history of preterm labor. Document Released: 12/25/2003 Document Revised: 06/06/2013 Document Reviewed: 11/06/2012 [facility] Patient Information 2015 [place], [place]. This information is not intended to replace advice given to you by your health care provider. Make sure you discuss any questions you have with your health care provider.

## 2014-07-17 NOTE — Progress Notes (Signed)
Needs prescription for prenatal today. 28 wk labs plus glucola. Tdap vaccine today.

## 2014-07-18 ENCOUNTER — Encounter: Payer: Self-pay | Admitting: Advanced Practice Midwife

## 2014-07-18 LAB — CBC
HCT: 28.4 % — ABNORMAL LOW (ref 36.0–46.0)
HEMOGLOBIN: 9 g/dL — AB (ref 12.0–15.0)
MCH: 27.1 pg (ref 26.0–34.0)
MCHC: 31.7 g/dL (ref 30.0–36.0)
MCV: 85.5 fL (ref 78.0–100.0)
PLATELETS: 229 10*3/uL (ref 150–400)
RBC: 3.32 MIL/uL — AB (ref 3.87–5.11)
RDW: 14.4 % (ref 11.5–15.5)
WBC: 8.5 10*3/uL (ref 4.0–10.5)

## 2014-07-18 LAB — GLUCOSE TOLERANCE, 1 HOUR (50G) W/O FASTING: Glucose, 1 Hour GTT: 125 mg/dL (ref 70–140)

## 2014-07-18 LAB — HIV ANTIBODY (ROUTINE TESTING W REFLEX): HIV: NONREACTIVE

## 2014-07-18 LAB — RPR

## 2014-07-26 ENCOUNTER — Emergency Department (HOSPITAL_BASED_OUTPATIENT_CLINIC_OR_DEPARTMENT_OTHER)
Admission: EM | Admit: 2014-07-26 | Discharge: 2014-07-26 | Disposition: A | Discharge status: Home / Self Care | Payer: Medicaid Other | Attending: Emergency Medicine | Admitting: Emergency Medicine

## 2014-07-26 DIAGNOSIS — Z3A30 30 weeks gestation of pregnancy: Secondary | ICD-10-CM | POA: Diagnosis not present

## 2014-07-26 DIAGNOSIS — O99013 Anemia complicating pregnancy, third trimester: Secondary | ICD-10-CM | POA: Insufficient documentation

## 2014-07-26 DIAGNOSIS — O26893 Other specified pregnancy related conditions, third trimester: Secondary | ICD-10-CM | POA: Insufficient documentation

## 2014-07-26 DIAGNOSIS — M5431 Sciatica, right side: Secondary | ICD-10-CM | POA: Insufficient documentation

## 2014-07-26 DIAGNOSIS — Z8742 Personal history of other diseases of the female genital tract: Secondary | ICD-10-CM | POA: Insufficient documentation

## 2014-07-26 DIAGNOSIS — D649 Anemia, unspecified: Secondary | ICD-10-CM

## 2014-07-26 DIAGNOSIS — O99891 Other specified diseases and conditions complicating pregnancy: Secondary | ICD-10-CM

## 2014-07-26 DIAGNOSIS — Z87891 Personal history of nicotine dependence: Secondary | ICD-10-CM | POA: Diagnosis not present

## 2014-07-26 DIAGNOSIS — Z79899 Other long term (current) drug therapy: Secondary | ICD-10-CM | POA: Diagnosis not present

## 2014-07-26 DIAGNOSIS — O9989 Other specified diseases and conditions complicating pregnancy, childbirth and the puerperium: Secondary | ICD-10-CM

## 2014-07-26 DIAGNOSIS — E876 Hypokalemia: Secondary | ICD-10-CM | POA: Diagnosis not present

## 2014-07-26 DIAGNOSIS — M549 Dorsalgia, unspecified: Secondary | ICD-10-CM

## 2014-07-26 LAB — URINALYSIS, ROUTINE W REFLEX MICROSCOPIC
Bilirubin Urine: NEGATIVE
Glucose, UA: NEGATIVE mg/dL
Hgb urine dipstick: NEGATIVE
Ketones, ur: NEGATIVE mg/dL
LEUKOCYTES UA: NEGATIVE
Nitrite: NEGATIVE
PROTEIN: NEGATIVE mg/dL
SPECIFIC GRAVITY, URINE: 1.009 (ref 1.005–1.030)
UROBILINOGEN UA: 0.2 mg/dL (ref 0.0–1.0)
pH: 6.5 (ref 5.0–8.0)

## 2014-07-26 LAB — BASIC METABOLIC PANEL
ANION GAP: 14 (ref 5–15)
BUN: 3 mg/dL — ABNORMAL LOW (ref 6–23)
CALCIUM: 8.4 mg/dL (ref 8.4–10.5)
CHLORIDE: 104 meq/L (ref 96–112)
CO2: 21 meq/L (ref 19–32)
Creatinine, Ser: 0.5 mg/dL (ref 0.50–1.10)
GFR calc Af Amer: >90 mL/min (ref >90)
GFR calc non Af Amer: >90 mL/min (ref >90)
GLUCOSE: 71 mg/dL (ref 70–99)
Potassium: 3.2 mEq/L — ABNORMAL LOW (ref 3.7–5.3)
SODIUM: 139 meq/L (ref 137–147)

## 2014-07-26 LAB — CBC WITH DIFFERENTIAL/PLATELET
Basophils Absolute: 0 10*3/uL (ref 0.0–0.1)
Basophils Relative: 0 % (ref 0–1)
EOS ABS: 0 10*3/uL (ref 0.0–0.7)
Eosinophils Relative: 1 % (ref 0–5)
HCT: 27.3 % — ABNORMAL LOW (ref 36.0–46.0)
HEMOGLOBIN: 8.8 g/dL — AB (ref 12.0–15.0)
LYMPHS ABS: 2.5 10*3/uL (ref 0.7–4.0)
Lymphocytes Relative: 31 % (ref 12–46)
MCH: 27.2 pg (ref 26.0–34.0)
MCHC: 32.2 g/dL (ref 30.0–36.0)
MCV: 84.5 fL (ref 78.0–100.0)
Monocytes Absolute: 0.7 10*3/uL (ref 0.1–1.0)
Monocytes Relative: 9 % (ref 3–12)
NEUTROS ABS: 4.8 10*3/uL (ref 1.7–7.7)
Neutrophils Relative %: 59 % (ref 43–77)
PLATELETS: 218 10*3/uL (ref 150–400)
RBC: 3.23 MIL/uL — ABNORMAL LOW (ref 3.87–5.11)
RDW: 13.9 % (ref 11.5–15.5)
WBC: 8 10*3/uL (ref 4.0–10.5)

## 2014-07-26 MED ORDER — POTASSIUM CHLORIDE CRYS ER 20 MEQ PO TBCR
40.0000 meq | EXTENDED_RELEASE_TABLET | Freq: Once | ORAL | Status: AC
  Administered 2014-07-26: 40 meq via ORAL
  Filled 2014-07-26: qty 2

## 2014-07-26 MED ORDER — OXYCODONE-ACETAMINOPHEN 5-325 MG PO TABS
2.0000 | ORAL_TABLET | Freq: Once | ORAL | Status: AC
  Administered 2014-07-26: 2 via ORAL
  Filled 2014-07-26: qty 2

## 2014-07-26 MED ORDER — SODIUM CHLORIDE 0.9 % IV BOLUS (SEPSIS)
1000.0000 mL | Freq: Once | INTRAVENOUS | Status: AC
  Administered 2014-07-26: 1000 mL via INTRAVENOUS

## 2014-07-26 NOTE — Progress Notes (Addendum)
[facility] down her back and leg. No c/o UCs, vaginal bleeding or any other OB complaints. Pt has had 3 prior C/S and plans a repeat C/S. FHR tracing reassuring and reactive. Pt to be D/C home by EDP

## 2014-07-26 NOTE — ED Notes (Addendum)
7 months pregnant c/o back pain x 1 week. Pain goes down her right leg. Denies vaginal discharge.

## 2014-07-26 NOTE — Discharge Instructions (Signed)
Hypokalemia Hypokalemia means that the amount of potassium in the blood is lower than normal.Potassium is a chemical, called an electrolyte, that helps regulate the amount of fluid in the body. It also stimulates muscle contraction and helps nerves function properly.Most of the body's potassium is inside of cells, and only a very small amount is in the blood. Because the amount in the blood is so small, minor changes can be life-threatening. CAUSES  Antibiotics.  Diarrhea or vomiting.  Using laxatives too much, which can cause diarrhea.  Chronic kidney disease.  Water pills (diuretics).  Eating disorders (bulimia).  Low magnesium level.  Sweating a lot. SIGNS AND SYMPTOMS  Weakness.  Constipation.  Fatigue.  Muscle cramps.  Mental confusion.  Skipped heartbeats or irregular heartbeat (palpitations).  Tingling or numbness. DIAGNOSIS  Your health care provider can diagnose hypokalemia with blood tests. In addition to checking your potassium level, your health care provider may also check other lab tests. TREATMENT Hypokalemia can be treated with potassium supplements taken by mouth or adjustments in your current medicines. If your potassium level is very low, you may need to get potassium through a vein (IV) and be monitored in the hospital. A diet high in potassium is also helpful. Foods high in potassium are:  Nuts, such as peanuts and pistachios.  Seeds, such as sunflower seeds and pumpkin seeds.  Peas, lentils, and lima beans.  Whole grain and bran cereals and breads.  Fresh fruit and vegetables, such as apricots, avocado, bananas, cantaloupe, kiwi, oranges, tomatoes, asparagus, and potatoes.  Orange and tomato juices.  Red meats.  Fruit yogurt. HOME CARE INSTRUCTIONS  Take all medicines as prescribed by your health care provider.  Maintain a healthy diet by including nutritious food, such as fruits, vegetables, nuts, whole grains, and lean meats.  If  you are taking a laxative, be sure to follow the directions on the label. SEEK MEDICAL CARE IF:  Your weakness gets worse.  You feel your heart pounding or racing.  You are vomiting or having diarrhea.  You are diabetic and having trouble keeping your blood glucose in the normal range. SEEK IMMEDIATE MEDICAL CARE IF:  You have chest pain, shortness of breath, or dizziness.  You are vomiting or having diarrhea for more than 2 days.  You faint. MAKE SURE YOU:   Understand these instructions.  Will watch your condition.  Will get help right away if you are not doing well or get worse. Document Released: 10/04/2005 Document Revised: 07/25/2013 Document Reviewed: 04/06/2013 [facility] Patient Information 2015 [place], [place]. This information is not intended to replace advice given to you by your health care provider. Make sure you discuss any questions you have with your health care provider. Pregnancy and Anemia Anemia is a condition in which the concentration of red blood cells or hemoglobin in the blood is below normal. Hemoglobin is a substance in red blood cells that carries oxygen to the tissues of the body. Anemia results in not enough oxygen reaching these tissues.  Anemia during pregnancy is common because the fetus uses more iron and folic acid as it is developing. Your body may not produce enough red blood cells because of this. Also, during pregnancy, the liquid part of the blood (plasma) increases by about 50%, and the red blood cells increase by only 25%. This lowers the concentration of the red blood cells and creates a natural anemia-like situation.  CAUSES  The most common cause of anemia during pregnancy is not having enough iron  in the body to make red blood cells (iron deficiency anemia). Other causes may include:  Folic acid deficiency.  Vitamin B12 deficiency.  Certain prescription or over-the-counter medicines.  Certain medical conditions or infections that  destroy red blood cells.  A low platelet count and bleeding caused by antibodies that go through the placenta to the fetus from the mother's blood. SIGNS AND SYMPTOMS  Mild anemia may not be noticeable. If it becomes severe, symptoms may include:  Tiredness.  Shortness of breath, especially with exercise.  Weakness.  Fainting.  Pale looking skin.  Headaches.  Feeling a fast or irregular heartbeat (palpitations). DIAGNOSIS  The type of anemia is usually diagnosed from your family and medical history and blood tests. TREATMENT  Treatment of anemia during pregnancy depends on the cause of the anemia. Treatment can include:  Supplements of iron, vitamin B12, or folic acid.  A blood transfusion. This may be needed if blood loss is severe.  Hospitalization. This may be needed if there is significant continual blood loss.  Dietary changes. HOME CARE INSTRUCTIONS   Follow your dietitian's or health care provider's dietary recommendations.  Increase your vitamin C intake. This will help the stomach absorb more iron.  Eat a diet rich in iron. This would include foods such as:  Liver.  Beef.  Whole grain bread.  Eggs.  Dried fruit.  Take iron and vitamins as directed by your health care provider.  Eat green leafy vegetables. These are a good source of folic acid. SEEK MEDICAL CARE IF:   You have frequent or lasting headaches.  You are looking pale.  You are bruising easily. SEEK IMMEDIATE MEDICAL CARE IF:   You have extreme weakness, shortness of breath, or chest pain.  You become dizzy or have trouble concentrating.  You have heavy vaginal bleeding.  You develop a rash.  You have bloody or black, tarry stools.  You faint.  You vomit up blood.  You vomit repeatedly.  You have abdominal pain.  You have a fever or persistent symptoms for more than 2-3 days.  You have a fever and your symptoms suddenly get worse.  You are dehydrated. MAKE SURE  YOU:   Understand these instructions.  Will watch your condition.  Will get help right away if you are not doing well or get worse. Document Released: 10/01/2000 Document Revised: 07/25/2013 Document Reviewed: 05/16/2013 [facility] Patient Information 2015 [place], [place]. This information is not intended to replace advice given to you by your health care provider. Make sure you discuss any questions you have with your health care provider. Sciatica Sciatica is pain, weakness, numbness, or tingling along your sciatic nerve. The nerve starts in the lower back and runs down the back of each leg. Nerve damage or certain conditions pinch or put pressure on the sciatic nerve. This causes the pain, weakness, and other discomforts of sciatica. HOME CARE   Only take medicine as told by your doctor.  Apply ice to the affected area for 20 minutes. Do this 3-4 times a day for the first 48-72 hours. Then try heat in the same way.  Exercise, stretch, or do your usual activities if these do not make your pain worse.  Go to physical therapy as told by your doctor.  Keep all doctor visits as told.  Do not wear high heels or shoes that are not supportive.  Get a firm mattress if your mattress is too soft to lessen pain and discomfort. GET HELP RIGHT AWAY IF:  You cannot control when you poop (bowel movement) or pee (urinate).  You have more weakness in your lower back, lower belly (pelvis), butt (buttocks), or legs.  You have redness or puffiness (swelling) of your back.  You have a burning feeling when you pee.  You have pain that gets worse when you lie down.  You have pain that wakes you from your sleep.  Your pain is worse than past pain.  Your pain lasts longer than 4 weeks.  You are suddenly losing weight without reason. MAKE SURE YOU:   Understand these instructions.  Will watch this condition.  Will get help right away if you are not doing well or get worse. Document  Released: 07/13/2008 Document Revised: 04/04/2012 Document Reviewed: 02/13/2012 [facility] Patient Information 2015 [place], [place]. This information is not intended to replace advice given to you by your health care provider. Make sure you discuss any questions you have with your health care provider.

## 2014-07-28 NOTE — ED Provider Notes (Signed)
CSN: [id number]     Arrival date & time 07/26/14  1905 History   First MD Initiated Contact with Patient 07/26/14 1956     Chief Complaint  Patient presents with  . Back Pain     (Consider location/radiation/quality/duration/timing/severity/associated sxs/prior Treatment) HPI  28 y.o. Female g3p2 presents today at [redacted] weeks gestation complaining of several days of worsened low back pain with radiation to right leg.  Pain is worsened with certain positions.  She denies numbness and tingling and weakness.  She denies uti symptoms or symptoms c.w. Contractions.   Past Medical History  Diagnosis Date  . Ovarian cyst    Past Surgical History  Procedure Laterality Date  . Cesarean section    . Induced abortion     Family History  Problem Relation Age of Onset  . Hypertension Mother   . Sickle cell anemia Sister   . Diabetes Maternal Aunt   . Hypertension Maternal Aunt   . Stroke Maternal Grandmother   . Asthma Maternal Grandmother   . Hypertension Maternal Grandmother   . Hyperlipidemia Maternal Grandmother   . Heart disease Maternal Grandmother    History  Substance Use Topics  . Smoking status: Former Games developermoker  . Smokeless tobacco: Never Used  . Alcohol Use: No   OB History   Grav Para Term Preterm Abortions TAB SAB Ect Mult Living   5 3 3  1 1    3      Review of Systems  All other systems reviewed and are negative.     Allergies  Review of patient's allergies indicates no known allergies.  Home Medications   Prior to Admission medications   Medication Sig Start Date End Date Taking? Authorizing Provider  cyclobenzaprine (FLEXERIL) 10 MG tablet Take 1 tablet (10 mg total) by mouth 3 (three) times daily as needed for muscle spasms. 03/17/14   Aviva SignsMarie L Williams, CNM  Prenat w/o A Vit-FeFum-FePo-FA (CONCEPT OB) 130-92.4-1 MG CAPS Take 1 tablet by mouth daily. 07/17/14   Dorathy KinsmanVirginia Smith, CNM   BP 124/76  Pulse 97  Temp(Src) 99.4 F (37.4 C) (Oral)  Resp 20  Wt 183  lb (83.008 kg)  SpO2 100%  LMP 01/09/2014 Physical Exam  Nursing note and vitals reviewed. Constitutional: She is oriented to person, place, and time. She appears well-developed and well-nourished.  HENT:  Head: Normocephalic and atraumatic.  Right Ear: Tympanic membrane and external ear normal.  Left Ear: Tympanic membrane and external ear normal.  Nose: Nose normal. Right sinus exhibits no maxillary sinus tenderness and no frontal sinus tenderness. Left sinus exhibits no maxillary sinus tenderness and no frontal sinus tenderness.  Eyes: Conjunctivae and EOM are normal. Pupils are equal, round, and reactive to light. Right eye exhibits no nystagmus. Left eye exhibits no nystagmus.  Neck: Normal range of motion. Neck supple.  Cardiovascular: Normal rate, regular rhythm, normal heart sounds and intact distal pulses.   Pulmonary/Chest: Effort normal and breath sounds normal. No respiratory distress. She exhibits no tenderness.  Abdominal: Soft. Bowel sounds are normal. She exhibits no distension and no mass. There is no tenderness.  Genitourinary:  Gravid uterus c.w. Dates Nontender.   Musculoskeletal: Normal range of motion. She exhibits no edema and no tenderness.  Neurological: She is alert and oriented to person, place, and time. She has normal strength and normal reflexes. No sensory deficit. She displays a negative Romberg sign. GCS eye subscore is 4. GCS verbal subscore is 5. GCS motor subscore is 6.  Reflex  Scores:      Tricep reflexes are 2+ on the right side and 2+ on the left side.      Bicep reflexes are 2+ on the right side and 2+ on the left side.      Brachioradialis reflexes are 2+ on the right side and 2+ on the left side.      Patellar reflexes are 2+ on the right side and 2+ on the left side.      Achilles reflexes are 2+ on the right side and 2+ on the left side. Patient with normal gait without ataxia, shuffling, spasm, or antalgia. Speech is normal without dysarthria,  dysphasia, or aphasia. Muscle strength is 5/5 in bilateral shoulders, elbow flexor and extensors, wrist flexor and extensors, and intrinsic hand muscles. 5/5 bilateral lower extremity hip flexors, extensors, knee flexors and extensors, and ankle dorsi and plantar flexors.    Skin: Skin is warm and dry. No rash noted.  Psychiatric: She has a normal mood and affect. Her behavior is normal. Judgment and thought content normal.    ED Course  Procedures (including critical care time) Labs Review Labs Reviewed  CBC WITH DIFFERENTIAL - Abnormal; Notable for the following:    RBC 3.23 (*)    Hemoglobin 8.8 (*)    HCT 27.3 (*)    All other components within normal limits  BASIC METABOLIC PANEL - Abnormal; Notable for the following:    Potassium 3.2 (*)    BUN 3 (*)    All other components within normal limits  URINALYSIS, ROUTINE W REFLEX MICROSCOPIC    Imaging Review No results found.   EKG Interpretation None     Patient observed on monitor- no contractions noted by mau note. Discussed all results with patient.  MDM   Final diagnoses:  Back pain affecting pregnancy in third trimester  Sciatica neuralgia, right  Hypokalemia  Anemia, unspecified anemia type        Hilario Quarryanielle S Keyandre Pileggi, MD 07/28/14 0009

## 2014-08-01 ENCOUNTER — Ambulatory Visit (INDEPENDENT_AMBULATORY_CARE_PROVIDER_SITE_OTHER): Payer: Medicaid Other | Admitting: Physician Assistant

## 2014-08-01 VITALS — BP 118/72 | HR 102 | Wt 192.4 lb

## 2014-08-01 DIAGNOSIS — Z3493 Encounter for supervision of normal pregnancy, unspecified, third trimester: Secondary | ICD-10-CM

## 2014-08-01 LAB — POCT URINALYSIS DIP (DEVICE)
Bilirubin Urine: NEGATIVE
GLUCOSE, UA: NEGATIVE mg/dL
HGB URINE DIPSTICK: NEGATIVE
KETONES UR: NEGATIVE mg/dL
Leukocytes, UA: NEGATIVE
Nitrite: NEGATIVE
Protein, ur: NEGATIVE mg/dL
Specific Gravity, Urine: 1.01 (ref 1.005–1.030)
UROBILINOGEN UA: 1 mg/dL (ref 0.0–1.0)
pH: 6.5 (ref 5.0–8.0)

## 2014-08-01 NOTE — Progress Notes (Signed)
30 weeks, irregular contractions.  Denies vaginal bleeding, LOF, dysuria.  Endorses good fetal movement. Desires BTL. History of 3 previous LTCS and will need section scheduled for delivery. PTL precautions discussed..    RTC 2 weeks.

## 2014-08-01 NOTE — Patient Instructions (Signed)
Breastfeeding Deciding to breastfeed is one of the best choices you can make for you and your baby. A change in hormones during pregnancy causes your breast tissue to grow and increases the number and size of your milk ducts. These hormones also allow proteins, sugars, and fats from your blood supply to make breast milk in your milk-producing glands. Hormones prevent breast milk from being released before your baby is born as well as prompt milk flow after birth. Once breastfeeding has begun, thoughts of your baby, as well as his or her sucking or crying, can stimulate the release of milk from your milk-producing glands.  BENEFITS OF BREASTFEEDING For Your Baby  Your first milk (colostrum) helps your baby's digestive system function better.   There are antibodies in your milk that help your baby fight off infections.   Your baby has a lower incidence of asthma, allergies, and sudden infant death syndrome.   The nutrients in breast milk are better for your baby than infant formulas and are designed uniquely for your baby's needs.   Breast milk improves your baby's brain development.   Your baby is less likely to develop other conditions, such as childhood obesity, asthma, or type 2 diabetes mellitus.  For You   Breastfeeding helps to create a very special bond between you and your baby.   Breastfeeding is convenient. Breast milk is always available at the correct temperature and costs nothing.   Breastfeeding helps to burn calories and helps you lose the weight gained during pregnancy.   Breastfeeding makes your uterus contract to its prepregnancy size faster and slows bleeding (lochia) after you give birth.   Breastfeeding helps to lower your risk of developing type 2 diabetes mellitus, osteoporosis, and breast or ovarian cancer later in life. SIGNS THAT YOUR BABY IS HUNGRY Early Signs of Hunger  Increased alertness or activity.  Stretching.  Movement of the head from  side to side.  Movement of the head and opening of the mouth when the corner of the mouth or cheek is stroked (rooting).  Increased sucking sounds, smacking lips, cooing, sighing, or squeaking.  Hand-to-mouth movements.  Increased sucking of fingers or hands. Late Signs of Hunger  Fussing.  Intermittent crying. Extreme Signs of Hunger Signs of extreme hunger will require calming and consoling before your baby will be able to breastfeed successfully. Do not wait for the following signs of extreme hunger to occur before you initiate breastfeeding:   Restlessness.  A loud, strong cry.   Screaming. BREASTFEEDING BASICS Breastfeeding Initiation  Find a comfortable place to sit or lie down, with your neck and back well supported.  Place a pillow or rolled up blanket under your baby to bring him or her to the level of your breast (if you are seated). Nursing pillows are specially designed to help support your arms and your baby while you breastfeed.  Make sure that your baby's abdomen is facing your abdomen.   Gently massage your breast. With your fingertips, massage from your chest wall toward your nipple in a circular motion. This encourages milk flow. You may need to continue this action during the feeding if your milk flows slowly.  Support your breast with 4 fingers underneath and your thumb above your nipple. Make sure your fingers are well away from your nipple and your baby's mouth.   Stroke your baby's lips gently with your finger or nipple.   When your baby's mouth is open wide enough, quickly bring your baby to your   breast, placing your entire nipple and as much of the colored area around your nipple (areola) as possible into your baby's mouth.   More areola should be visible above your baby's upper lip than below the lower lip.   Your baby's tongue should be between his or her lower gum and your breast.   Ensure that your baby's mouth is correctly positioned  around your nipple (latched). Your baby's lips should create a seal on your breast and be turned out (everted).  It is common for your baby to suck about 2-3 minutes in order to start the flow of breast milk. Latching Teaching your baby how to latch on to your breast properly is very important. An improper latch can cause nipple pain and decreased milk supply for you and poor weight gain in your baby. Also, if your baby is not latched onto your nipple properly, he or she may swallow some air during feeding. This can make your baby fussy. Burping your baby when you switch breasts during the feeding can help to get rid of the air. However, teaching your baby to latch on properly is still the best way to prevent fussiness from swallowing air while breastfeeding. Signs that your baby has successfully latched on to your nipple:    Silent tugging or silent sucking, without causing you pain.   Swallowing heard between every 3-4 sucks.    Muscle movement above and in front of his or her ears while sucking.  Signs that your baby has not successfully latched on to nipple:   Sucking sounds or smacking sounds from your baby while breastfeeding.  Nipple pain. If you think your baby has not latched on correctly, slip your finger into the corner of your baby's mouth to break the suction and place it between your baby's gums. Attempt breastfeeding initiation again. Signs of Successful Breastfeeding Signs from your baby:   A gradual decrease in the number of sucks or complete cessation of sucking.   Falling asleep.   Relaxation of his or her body.   Retention of a small amount of milk in his or her mouth.   Letting go of your breast by himself or herself. Signs from you:  Breasts that have increased in firmness, weight, and size 1-3 hours after feeding.   Breasts that are softer immediately after breastfeeding.  Increased milk volume, as well as a change in milk consistency and color by  the fifth day of breastfeeding.   Nipples that are not sore, cracked, or bleeding. Signs That Your Baby is Getting Enough Milk  Wetting at least 3 diapers in a 24-hour period. The urine should be clear and pale yellow by age 5 days.  At least 3 stools in a 24-hour period by age 5 days. The stool should be soft and yellow.  At least 3 stools in a 24-hour period by age 7 days. The stool should be seedy and yellow.  No loss of weight greater than 10% of birth weight during the first 3 days of age.  Average weight gain of 4-7 ounces (113-198 g) per week after age 4 days.  Consistent daily weight gain by age 5 days, without weight loss after the age of 2 weeks. After a feeding, your baby may spit up a small amount. This is common. BREASTFEEDING FREQUENCY AND DURATION Frequent feeding will help you make more milk and can prevent sore nipples and breast engorgement. Breastfeed when you feel the need to reduce the fullness of your breasts   or when your baby shows signs of hunger. This is called "breastfeeding on demand." Avoid introducing a pacifier to your baby while you are working to establish breastfeeding (the first 4-6 weeks after your baby is born). After this time you may choose to use a pacifier. Research has shown that pacifier use during the first year of a baby's life decreases the risk of sudden infant death syndrome (SIDS). Allow your baby to feed on each breast as long as he or she wants. Breastfeed until your baby is finished feeding. When your baby unlatches or falls asleep while feeding from the first breast, offer the second breast. Because newborns are often sleepy in the first few weeks of life, you may need to awaken your baby to get him or her to feed. Breastfeeding times will vary from baby to baby. However, the following rules can serve as a guide to help you ensure that your baby is properly fed:  Newborns (babies 4 weeks of age or younger) may breastfeed every 1-3  hours.  Newborns should not go longer than 3 hours during the day or 5 hours during the night without breastfeeding.  You should breastfeed your baby a minimum of 8 times in a 24-hour period until you begin to introduce solid foods to your baby at around 6 months of age. BREAST MILK PUMPING Pumping and storing breast milk allows you to ensure that your baby is exclusively fed your breast milk, even at times when you are unable to breastfeed. This is especially important if you are going back to work while you are still breastfeeding or when you are not able to be present during feedings. Your lactation consultant can give you guidelines on how long it is safe to store breast milk.  A breast pump is a machine that allows you to pump milk from your breast into a sterile bottle. The pumped breast milk can then be stored in a refrigerator or freezer. Some breast pumps are operated by hand, while others use electricity. Ask your lactation consultant which type will work best for you. Breast pumps can be purchased, but some hospitals and breastfeeding support groups lease breast pumps on a monthly basis. A lactation consultant can teach you how to hand express breast milk, if you prefer not to use a pump.  CARING FOR YOUR BREASTS WHILE YOU BREASTFEED Nipples can become dry, cracked, and sore while breastfeeding. The following recommendations can help keep your breasts moisturized and healthy:  Avoid using soap on your nipples.   Wear a supportive bra. Although not required, special nursing bras and tank tops are designed to allow access to your breasts for breastfeeding without taking off your entire bra or top. Avoid wearing underwire-style bras or extremely tight bras.  Air dry your nipples for 3-4minutes after each feeding.   Use only cotton bra pads to absorb leaked breast milk. Leaking of breast milk between feedings is normal.   Use lanolin on your nipples after breastfeeding. Lanolin helps to  maintain your skin's normal moisture barrier. If you use pure lanolin, you do not need to wash it off before feeding your baby again. Pure lanolin is not toxic to your baby. You may also hand express a few drops of breast milk and gently massage that milk into your nipples and allow the milk to air dry. In the first few weeks after giving birth, some women experience extremely full breasts (engorgement). Engorgement can make your breasts feel heavy, warm, and tender to the   touch. Engorgement peaks within 3-5 days after you give birth. The following recommendations can help ease engorgement:  Completely empty your breasts while breastfeeding or pumping. You may want to start by applying warm, moist heat (in the shower or with warm water-soaked hand towels) just before feeding or pumping. This increases circulation and helps the milk flow. If your baby does not completely empty your breasts while breastfeeding, pump any extra milk after he or she is finished.  Wear a snug bra (nursing or regular) or tank top for 1-2 days to signal your body to slightly decrease milk production.  Apply ice packs to your breasts, unless this is too uncomfortable for you.  Make sure that your baby is latched on and positioned properly while breastfeeding. If engorgement persists after 48 hours of following these recommendations, contact your health care provider or a lactation consultant. OVERALL HEALTH CARE RECOMMENDATIONS WHILE BREASTFEEDING  Eat healthy foods. Alternate between meals and snacks, eating 3 of each per day. Because what you eat affects your breast milk, some of the foods may make your baby more irritable than usual. Avoid eating these foods if you are sure that they are negatively affecting your baby.  Drink milk, fruit juice, and water to satisfy your thirst (about 10 glasses a day).   Rest often, relax, and continue to take your prenatal vitamins to prevent fatigue, stress, and anemia.  Continue  breast self-awareness checks.  Avoid chewing and smoking tobacco.  Avoid alcohol and drug use. Some medicines that may be harmful to your baby can pass through breast milk. It is important to ask your health care provider before taking any medicine, including all over-the-counter and prescription medicine as well as vitamin and herbal supplements. It is possible to become pregnant while breastfeeding. If birth control is desired, ask your health care provider about options that will be safe for your baby. SEEK MEDICAL CARE IF:   You feel like you want to stop breastfeeding or have become frustrated with breastfeeding.  You have painful breasts or nipples.  Your nipples are cracked or bleeding.  Your breasts are red, tender, or warm.  You have a swollen area on either breast.  You have a fever or chills.  You have nausea or vomiting.  You have drainage other than breast milk from your nipples.  Your breasts do not become full before feedings by the fifth day after you give birth.  You feel sad and depressed.  Your baby is too sleepy to eat well.  Your baby is having trouble sleeping.   Your baby is wetting less than 3 diapers in a 24-hour period.  Your baby has less than 3 stools in a 24-hour period.  Your baby's skin or the white part of his or her eyes becomes yellow.   Your baby is not gaining weight by 5 days of age. SEEK IMMEDIATE MEDICAL CARE IF:   Your baby is overly tired (lethargic) and does not want to wake up and feed.  Your baby develops an unexplained fever. Document Released: 10/04/2005 Document Revised: 10/09/2013 Document Reviewed: 03/28/2013 [employer] Patient Information 2015 [employer]. This information is not intended to replace advice given to you by your health care provider. Make sure you discuss any questions you have with your health care provider.  

## 2014-08-02 ENCOUNTER — Encounter: Payer: Self-pay | Admitting: *Deleted

## 2014-08-07 ENCOUNTER — Other Ambulatory Visit: Payer: Self-pay | Admitting: Physician Assistant

## 2014-08-07 ENCOUNTER — Ambulatory Visit (HOSPITAL_COMMUNITY)
Admission: RE | Admit: 2014-08-07 | Discharge: 2014-08-07 | Disposition: A | Discharge status: Home / Self Care | Payer: Medicaid Other | Source: Ambulatory Visit | Attending: Physician Assistant | Admitting: Physician Assistant

## 2014-08-07 DIAGNOSIS — Z3493 Encounter for supervision of normal pregnancy, unspecified, third trimester: Secondary | ICD-10-CM

## 2014-08-07 DIAGNOSIS — Z36 Encounter for antenatal screening of mother: Secondary | ICD-10-CM | POA: Insufficient documentation

## 2014-08-07 DIAGNOSIS — Z364 Encounter for antenatal screening for fetal growth retardation: Secondary | ICD-10-CM

## 2014-08-07 DIAGNOSIS — Z3A32 32 weeks gestation of pregnancy: Secondary | ICD-10-CM | POA: Diagnosis not present

## 2014-08-07 DIAGNOSIS — IMO0002 Reserved for concepts with insufficient information to code with codable children: Secondary | ICD-10-CM

## 2014-08-07 DIAGNOSIS — Z0489 Encounter for examination and observation for other specified reasons: Secondary | ICD-10-CM

## 2014-08-07 IMAGING — US US OB FOLLOW-UP
1 series · 12 of 28 positions shown · non-contrast
Comparison: none

[Series 1: us ob follow up · 63 acquisitions, 12 frames shown]
[im 3/63]
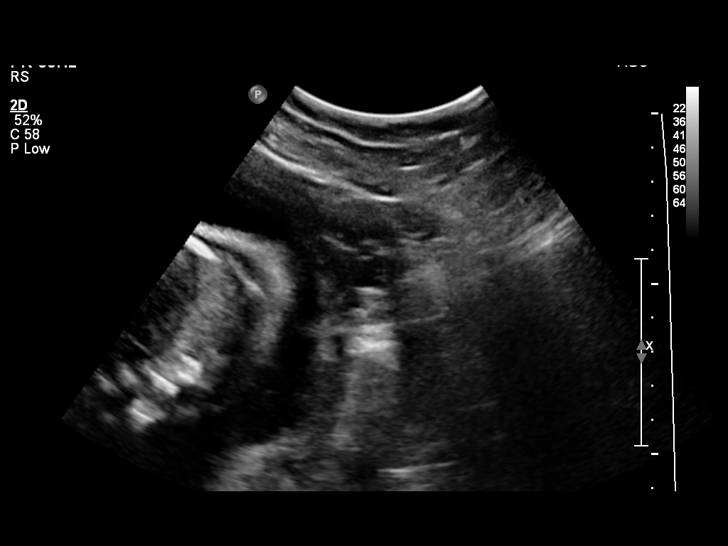
[im 7/63]
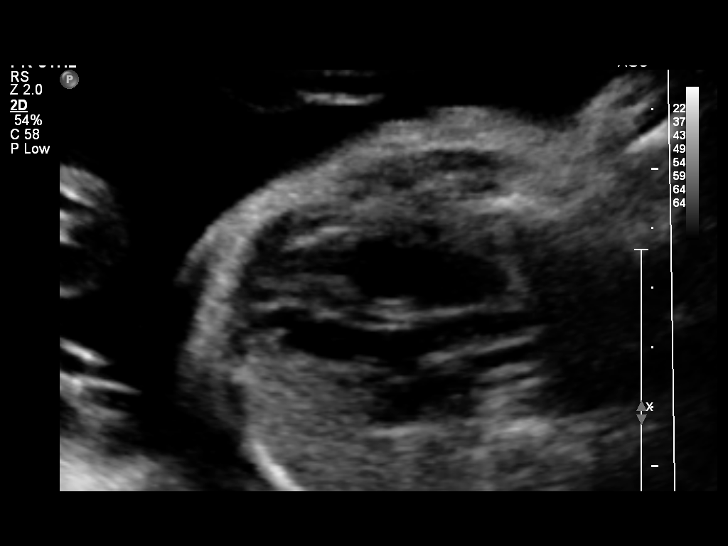
[im 12/63]
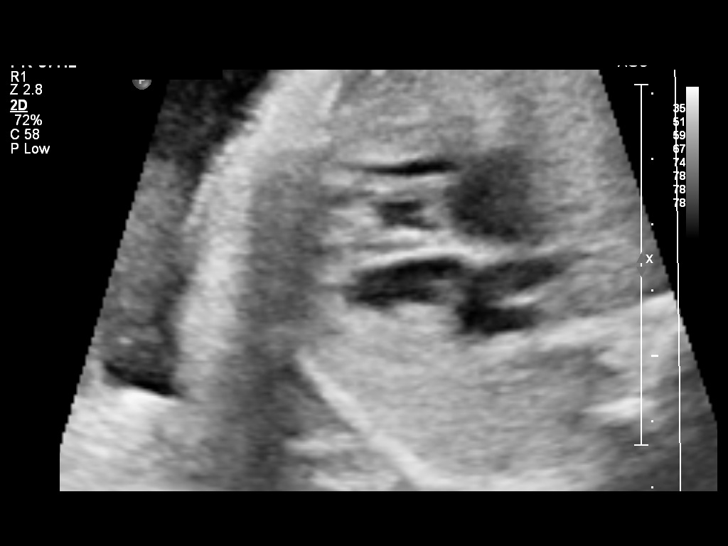
[im 19/63]
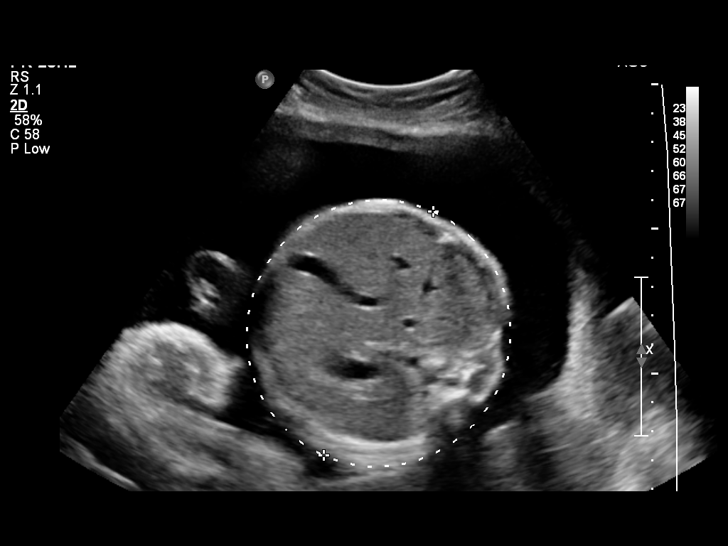
[im 23/63]
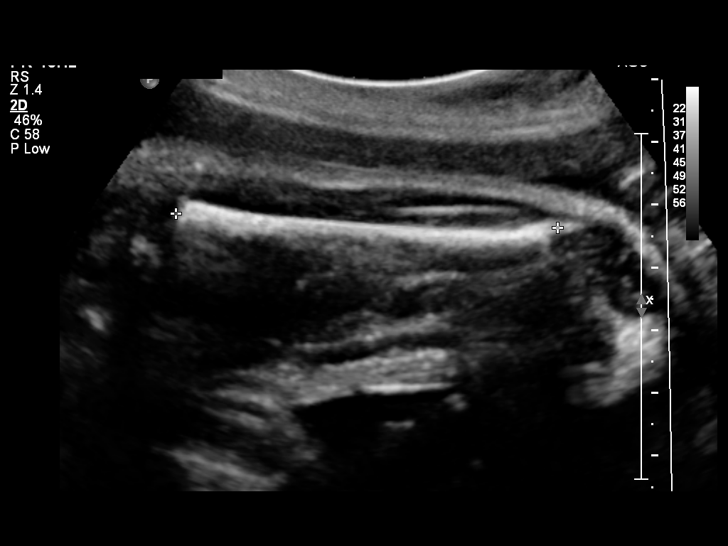
[im 28/63]
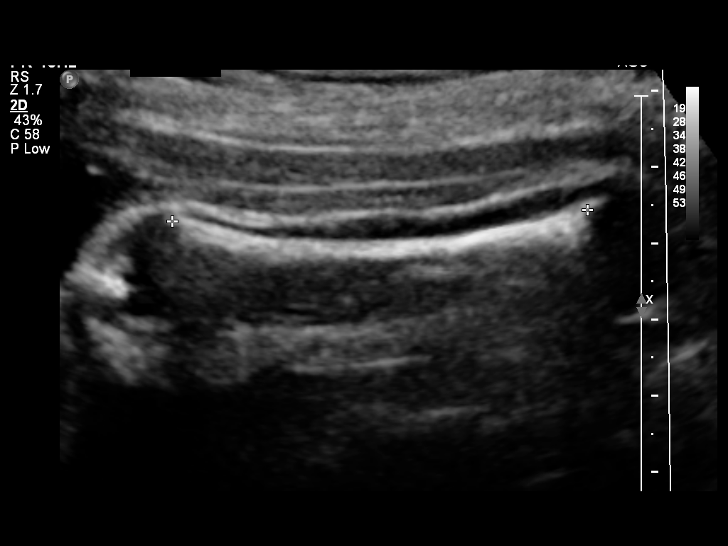
[im 35/63]
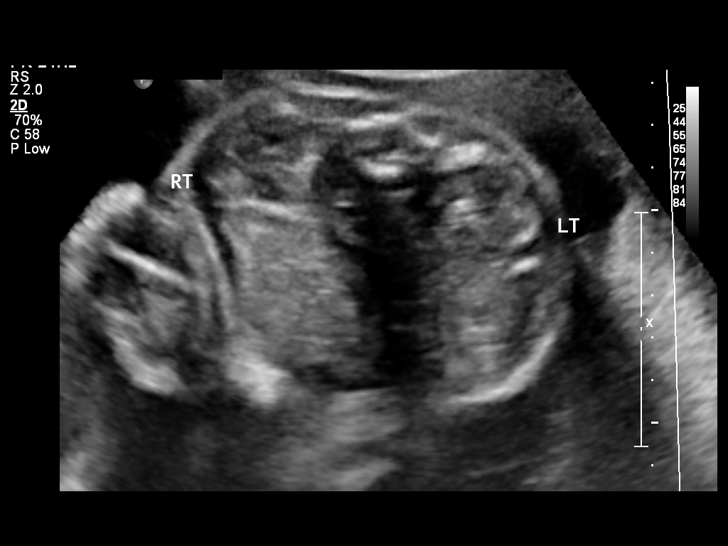
[im 40/63]
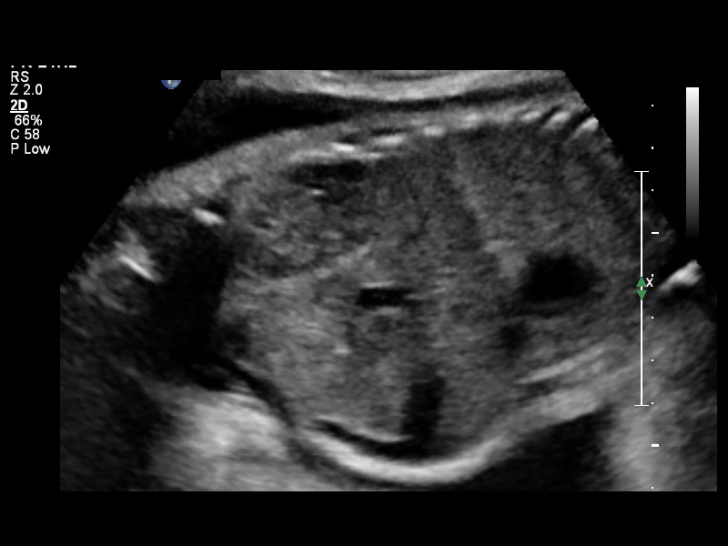
[im 44/63]
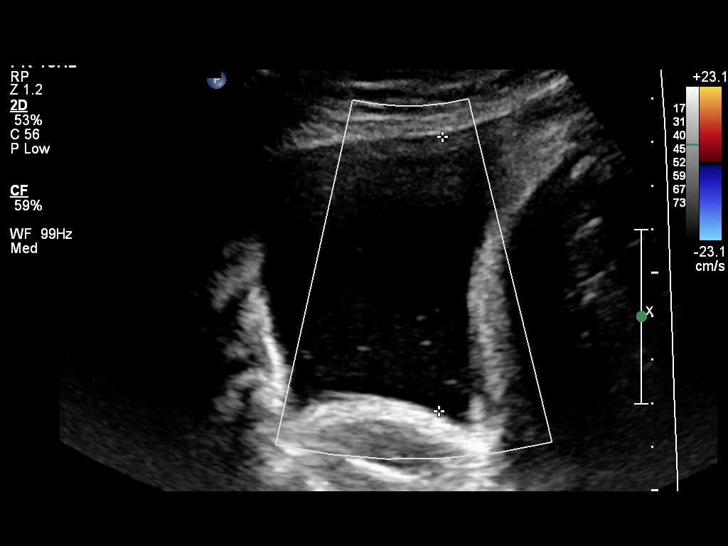
[im 51/63]
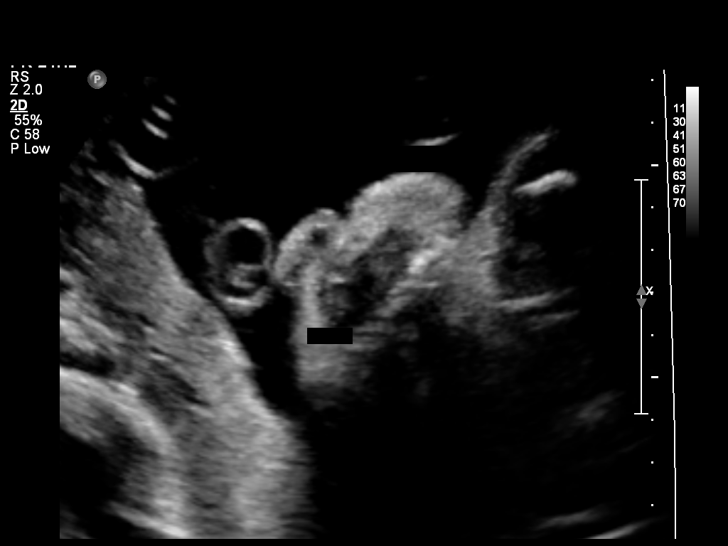
[im 56/63]
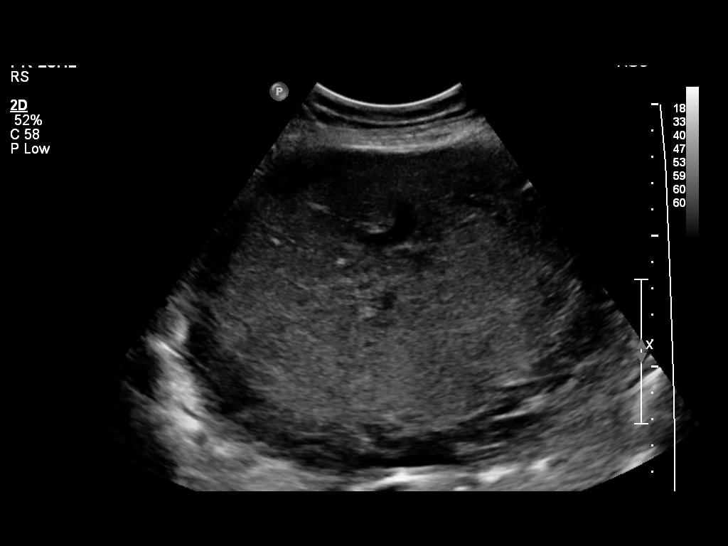
[im 60/63]
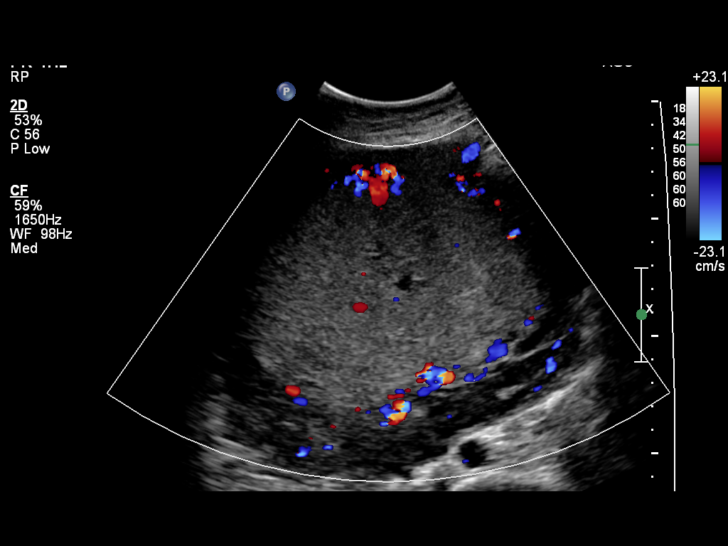

[12 of 28 positions shown; findings below may reference images not displayed]

OBSTETRICS REPORT
                      (Signed Final [DATE] [DATE])

Service(s) Provided

 US OB FOLLOW UP                                       76816.1
Indications

 Follow-up incomplete fetal anatomic evaluation        Z36
 31 weeks gestation of pregnancy
Fetal Evaluation

 Num Of Fetuses:    1
 Fetal Heart Rate:  138                          bpm
 Cardiac Activity:  Observed
 Presentation:      Cephalic
 Placenta:          Posterior, above cervical
                    os
 P. Cord            Previously Visualized
 Insertion:

 Amniotic Fluid
 AFI FV:      Subjectively within normal limits
 AFI Sum:     21.6    cm       83  %Tile     Larg Pckt:     7.7  cm
 RUQ:   6.28    cm   RLQ:    7.7    cm    LUQ:   4.43    cm   LLQ:    3.19   cm
Biometry

 BPD:     78.5  mm     G. Age:  31w 4d                CI:        70.28   70 - 86
                                                      FL/HC:      20.4   19.1 -

 HC:     298.6  mm     G. Age:  33w 0d       50  %    HC/AC:      1.04   0.96 -

 AC:     288.1  mm     G. Age:  32w 6d       79  %    FL/BPD:     77.6   71 - 87
 FL:      60.9  mm     G. Age:  31w 5d       35  %    FL/AC:      21.1   20 - 24
 HUM:     53.8  mm     G. Age:  31w 2d       43  %

 Est. FW:    [R0]  gm      4 lb 5 oz     69  %
Gestational Age

 LMP:           30w 0d        Date:  [DATE]                 EDD:   [DATE]
 U/S Today:     32w 2d                                        EDD:   [DATE]
 Best:          31w 5d     Det. By:  Early Ultrasound         EDD:   [DATE]
Anatomy
 Cranium:          Appears normal         Aortic Arch:      Appears normal
 Fetal Cavum:      Previously seen        Ductal Arch:      Previously seen
 Ventricles:       Appears normal         Diaphragm:        Appears normal
 Choroid Plexus:   Previously seen        Stomach:          Appears normal, left
                                                            sided
 Cerebellum:       Previously seen        Abdomen:          Appears normal
 Posterior Fossa:  Previously seen        Abdominal Wall:   Previously seen
 Nuchal Fold:      Not applicable (>20    Cord Vessels:     Previously seen
                   wks GA)
 Face:             Orbits and profile     Kidneys:          Appear normal
                   previously seen
 Lips:             Previously seen        Bladder:          Appears normal
 Heart:            Appears normal         Spine:            Previously seen
                   (4CH, axis, and
                   situs)
 RVOT:             Appears normal         Lower             Previously seen
                                          Extremities:
 LVOT:             Appears normal         Upper             Previously seen
                                          Extremities:

 Other:  Fetus appears to be a male. Heels and 5th digit previously visualized.
         Nasal bone previously visualized. Technically difficult due to fetal
         position.
Targeted Anatomy

 Fetal Central Nervous System
 Lat. Ventricles:
Cervix Uterus Adnexa

 Cervix:       Not visualized (advanced GA >[R0])
 Uterus:       No abnormality visualized.
 Left Ovary:    Not visualized.
 Right Ovary:   Not visualized.
 Adnexa:     No abnormality visualized. No adnexal mass visualized.
Impression

 Single IUP at 31w 5d
 Normal interval anatomy
 Fetal growth is appropriate (69th %tile)
 Posterior placenta without previa
 Normal amniotic fluid volume
Recommendations

 Follow-up ultrasounds as clinically indicated.

## 2014-08-11 ENCOUNTER — Encounter (HOSPITAL_COMMUNITY): Payer: Self-pay | Admitting: *Deleted

## 2014-08-11 ENCOUNTER — Inpatient Hospital Stay (HOSPITAL_COMMUNITY)
Admission: AD | Admit: 2014-08-11 | Discharge: 2014-08-11 | Disposition: A | Discharge status: Home / Self Care | Payer: BC Managed Care – PPO | Source: Ambulatory Visit | Attending: Obstetrics & Gynecology | Admitting: Obstetrics & Gynecology

## 2014-08-11 DIAGNOSIS — J029 Acute pharyngitis, unspecified: Secondary | ICD-10-CM | POA: Diagnosis not present

## 2014-08-11 DIAGNOSIS — O26893 Other specified pregnancy related conditions, third trimester: Secondary | ICD-10-CM | POA: Insufficient documentation

## 2014-08-11 DIAGNOSIS — Z3A31 31 weeks gestation of pregnancy: Secondary | ICD-10-CM | POA: Diagnosis not present

## 2014-08-11 DIAGNOSIS — O09899 Supervision of other high risk pregnancies, unspecified trimester: Secondary | ICD-10-CM

## 2014-08-11 DIAGNOSIS — O9989 Other specified diseases and conditions complicating pregnancy, childbirth and the puerperium: Secondary | ICD-10-CM

## 2014-08-11 DIAGNOSIS — Z283 Underimmunization status: Secondary | ICD-10-CM

## 2014-08-11 LAB — URINALYSIS, ROUTINE W REFLEX MICROSCOPIC
Bilirubin Urine: NEGATIVE
GLUCOSE, UA: NEGATIVE mg/dL
HGB URINE DIPSTICK: NEGATIVE
Ketones, ur: NEGATIVE mg/dL
LEUKOCYTES UA: NEGATIVE
Nitrite: NEGATIVE
PROTEIN: NEGATIVE mg/dL
Specific Gravity, Urine: 1.01 (ref 1.005–1.030)
Urobilinogen, UA: 1 mg/dL (ref 0.0–1.0)
pH: 7 (ref 5.0–8.0)

## 2014-08-11 LAB — RAPID STREP SCREEN (MED CTR MEBANE ONLY): Streptococcus, Group A Screen (Direct): NEGATIVE

## 2014-08-11 NOTE — MAU Provider Note (Signed)
Attestation of Attending Supervision of Obstetric Fellow: Evaluation and management procedures were performed by the Obstetric Fellow under my supervision and collaboration.  I have reviewed the Obstetric Fellow's note and chart, and I agree with the management and plan.  Jencarlo Bonadonna, MD, FACOG Attending Obstetrician & Gynecologist Faculty Practice, [facility]   

## 2014-08-11 NOTE — MAU Note (Signed)
Goes to clinic downstairs, throat has been hurting for 3 days, noticed vomiting with  blood in it last night. Denies fevers. Coughing a little. Feels like nose is stuffy at night and cant breathe.  Denies contractions, vaginal bleeding, leaking fluid.

## 2014-08-11 NOTE — MAU Provider Note (Signed)
Chief Complaint: Sore throat  Christine Cooke is a 28 y.o.  [id number] with IUP at 2820w2d presenting for sore throat. Patient states she has been having  none contractions, none vaginal bleeding, intact membranes, with active fetal movement.   States that three days ago began having a sore throat accompanied by general malaise. Yesterday had one episode of emesis. Is drinking well. Denies fever, cough, diarrhea, dysuria.   Menstrual History: OB History   Grav Para Term Preterm Abortions TAB SAB Ect Mult Living   5 3 3  1 1    3        Patient's last menstrual period was 01/09/2014.      Past Medical History  Diagnosis Date  . Ovarian cyst     Past Surgical History  Procedure Laterality Date  . Cesarean section    . Induced abortion      Family History  Problem Relation Age of Onset  . Hypertension Mother   . Sickle cell anemia Sister   . Diabetes Maternal Aunt   . Hypertension Maternal Aunt   . Stroke Maternal Grandmother   . Asthma Maternal Grandmother   . Hypertension Maternal Grandmother   . Hyperlipidemia Maternal Grandmother   . Heart disease Maternal Grandmother     History  Substance Use Topics  . Smoking status: Former Games developermoker  . Smokeless tobacco: Never Used  . Alcohol Use: No     No Known Allergies  Facility-administered medications prior to admission  Medication Dose Route Frequency Provider Last Rate Last Dose  . Tdap (BOOSTRIX) injection 0.5 mL  0.5 mL Intramuscular Once [place] Smith, CNM       Prescriptions prior to admission  Medication Sig Dispense Refill  . cyclobenzaprine (FLEXERIL) 10 MG tablet Take 1 tablet (10 mg total) by mouth 3 (three) times daily as needed for muscle spasms.  30 tablet  2  . Prenat w/o A Vit-FeFum-FePo-FA (CONCEPT OB) 130-92.4-1 MG CAPS Take 1 tablet by mouth daily.  30 capsule  12    Review of Systems - Negative except for what is mentioned in HPI.  Physical Exam  Blood pressure 134/65, pulse 108, temperature 97.6  F (36.4 C), temperature source Oral, resp. rate 18, height 5\' 7"  (1.702 m), weight 88.451 kg (195 lb), last menstrual period 01/09/2014, SpO2 100.00%. GENERAL: Well-developed, well-nourished female in no acute distress.  LUNGS: Clear to auscultation bilaterally.  HEART: Regular rate and rhythm. ABDOMEN: Soft, nontender, nondistended, gravid.  EXTREMITIES: Nontender, no edema, 2+ distal pulses. ENT: SLight erythema of oropharynx. No increase of tonsils. No vesicles, lesions. TMs clear w slightly increased pressure. No erythema, exudate. FHT:  Baseline rate 145 bpm   Variability moderate  Accelerations present   Decelerations none Contractions: none Labs: Results for orders placed during the hospital encounter of 08/11/14 (from the past 24 hour(s))  URINALYSIS, ROUTINE W REFLEX MICROSCOPIC   Collection Time    08/11/14 10:32 AM      Result Value Ref Range   Color, Urine YELLOW  YELLOW   APPearance CLEAR  CLEAR   Specific Gravity, Urine 1.010  1.005 - 1.030   pH 7.0  5.0 - 8.0   Glucose, UA NEGATIVE  NEGATIVE mg/dL   Hgb urine dipstick NEGATIVE  NEGATIVE   Bilirubin Urine NEGATIVE  NEGATIVE   Ketones, ur NEGATIVE  NEGATIVE mg/dL   Protein, ur NEGATIVE  NEGATIVE mg/dL   Urobilinogen, UA 1.0  0.0 - 1.0 mg/dL   Nitrite NEGATIVE  NEGATIVE   Leukocytes,  UA NEGATIVE  NEGATIVE    Imaging Studies:  [place] Ob Follow Up  08/07/2014   OBSTETRICAL ULTRASOUND: This exam was performed within a [facility]. The OB US report was generated in the AS system, and faxed to the ordering physician.   This report is available in the [employer] PACS. See the [facility] Obstetric US report via the Image Link.   Assessment: Christine Cooke is  28 y.o. [phone] at 6710w2d presents with No chief complaint on file. .  Plan:  #Sore throat --Rapid strep pending, will call pt w results if positive --Most likely viral uri.  --Supportive measures (rest, hydration, tylenol.)  #Gestation in 3rd  trimester --Continue prenatal care as previously scheduled --Maintain adequate hydration  #FWB --NST reactive.  Aldona BarKoch, Kari L 10/25/20151:24 PM   OB fellow attestation:  I have seen and examined this patient; I agree with above documentation in the resident's note.   Christine Cooke is a 28 y.o. [id number] reporting sore throat, malaise, nausea and vomiting yesterday (no vomiting since). +FM, denies LOF, VB, contractions, vaginal discharge.  PE: BP 127/68  Pulse 115  Temp(Src) 97.6 F (36.4 C) (Oral)  Resp 18  Ht 5\' 7"  (1.702 m)  Wt 195 lb (88.451 kg)  BMI 30.53 kg/m2  SpO2 100%  LMP 01/09/2014 Gen: calm comfortable, NAD Resp: normal effort, no distress Abd: gravid  ROS, labs, PMH reviewed NST reactive initially, then minimal variability which returned to good variability and reactive after pt given juice  Plan: - fetal kick counts reinforced, preterm labor precautions - continue routine follow up in OB clinic - low suspicion of strep, likely viral, supportive measures discussed  Perry MountACOSTA,Leah Skora ROCIO, MD 4:14 PM

## 2014-08-13 LAB — CULTURE, GROUP A STREP

## 2014-08-15 ENCOUNTER — Ambulatory Visit (INDEPENDENT_AMBULATORY_CARE_PROVIDER_SITE_OTHER): Payer: BC Managed Care – PPO | Admitting: Family Medicine

## 2014-08-15 VITALS — BP 124/64 | HR 102 | Temp 98.2°F | Wt 195.3 lb

## 2014-08-15 DIAGNOSIS — Z3493 Encounter for supervision of normal pregnancy, unspecified, third trimester: Secondary | ICD-10-CM

## 2014-08-15 DIAGNOSIS — O34219 Maternal care for unspecified type scar from previous cesarean delivery: Secondary | ICD-10-CM

## 2014-08-15 DIAGNOSIS — O3421 Maternal care for scar from previous cesarean delivery: Secondary | ICD-10-CM

## 2014-08-15 LAB — POCT URINALYSIS DIP (DEVICE)
Bilirubin Urine: NEGATIVE
Glucose, UA: NEGATIVE mg/dL
Hgb urine dipstick: NEGATIVE
Ketones, ur: NEGATIVE mg/dL
Leukocytes, UA: NEGATIVE
NITRITE: NEGATIVE
PH: 6.5 (ref 5.0–8.0)
PROTEIN: NEGATIVE mg/dL
Specific Gravity, Urine: 1.015 (ref 1.005–1.030)
UROBILINOGEN UA: 0.2 mg/dL (ref 0.0–1.0)

## 2014-08-15 NOTE — Progress Notes (Signed)
Reports edema in feet.  C/o of intermittent pelvic pressure, especially at night.

## 2014-08-15 NOTE — Progress Notes (Addendum)
No contractions.  Good fetal activity.  BTL papers signed on 10/15.  Has c/s scheduled for 12/11.  Labor precautions reviewed.  F/u 2 weeks.

## 2014-08-15 NOTE — Patient Instructions (Signed)
Third Trimester of Pregnancy The third trimester is from week 29 through week 42, months 7 through 9. The third trimester is a time when the fetus is growing rapidly. At the end of the ninth month, the fetus is about 20 inches in length and weighs 6-10 pounds.  BODY CHANGES Your body goes through many changes during pregnancy. The changes vary from woman to woman.   Your weight will continue to increase. You can expect to gain 25-35 pounds (11-16 kg) by the end of the pregnancy.  You may begin to get stretch marks on your hips, abdomen, and breasts.  You may urinate more often because the fetus is moving lower into your pelvis and pressing on your bladder.  You may develop or continue to have heartburn as a result of your pregnancy.  You may develop constipation because certain hormones are causing the muscles that push waste through your intestines to slow down.  You may develop hemorrhoids or swollen, bulging veins (varicose veins).  You may have pelvic pain because of the weight gain and pregnancy hormones relaxing your joints between the bones in your pelvis. Backaches may result from overexertion of the muscles supporting your posture.  You may have changes in your hair. These can include thickening of your hair, rapid growth, and changes in texture. Some women also have hair loss during or after pregnancy, or hair that feels dry or thin. Your hair will most likely return to normal after your baby is born.  Your breasts will continue to grow and be tender. A yellow discharge may leak from your breasts called colostrum.  Your belly button may stick out.  You may feel short of breath because of your expanding uterus.  You may notice the fetus "dropping," or moving lower in your abdomen.  You may have a bloody mucus discharge. This usually occurs a few days to a week before labor begins.  Your cervix becomes thin and soft (effaced) near your due date. WHAT TO EXPECT AT YOUR PRENATAL  EXAMS  You will have prenatal exams every 2 weeks until week 36. Then, you will have weekly prenatal exams. During a routine prenatal visit:  You will be weighed to make sure you and the fetus are growing normally.  Your blood pressure is taken.  Your abdomen will be measured to track your baby's growth.  The fetal heartbeat will be listened to.  Any test results from the previous visit will be discussed.  You may have a cervical check near your due date to see if you have effaced. At around 36 weeks, your caregiver will check your cervix. At the same time, your caregiver will also perform a test on the secretions of the vaginal tissue. This test is to determine if a type of bacteria, Group B streptococcus, is present. Your caregiver will explain this further. Your caregiver may ask you:  What your birth plan is.  How you are feeling.  If you are feeling the baby move.  If you have had any abnormal symptoms, such as leaking fluid, bleeding, severe headaches, or abdominal cramping.  If you have any questions. Other tests or screenings that may be performed during your third trimester include:  Blood tests that check for low iron levels (anemia).  Fetal testing to check the health, activity level, and growth of the fetus. Testing is done if you have certain medical conditions or if there are problems during the pregnancy. FALSE LABOR You may feel small, irregular contractions that   eventually go away. These are called Braxton Hicks contractions, or false labor. Contractions may last for hours, days, or even weeks before true labor sets in. If contractions come at regular intervals, intensify, or become painful, it is best to be seen by your caregiver.  SIGNS OF LABOR   Menstrual-like cramps.  Contractions that are 5 minutes apart or less.  Contractions that start on the top of the uterus and spread down to the lower abdomen and back.  A sense of increased pelvic pressure or back  pain.  A watery or bloody mucus discharge that comes from the vagina. If you have any of these signs before the 37th week of pregnancy, call your caregiver right away. You need to go to the hospital to get checked immediately. HOME CARE INSTRUCTIONS   Avoid all smoking, herbs, alcohol, and unprescribed drugs. These chemicals affect the formation and growth of the baby.  Follow your caregiver's instructions regarding medicine use. There are medicines that are either safe or unsafe to take during pregnancy.  Exercise only as directed by your caregiver. Experiencing uterine cramps is a good sign to stop exercising.  Continue to eat regular, healthy meals.  Wear a good support bra for breast tenderness.  Do not use hot tubs, steam rooms, or saunas.  Wear your seat belt at all times when driving.  Avoid raw meat, uncooked cheese, cat litter boxes, and soil used by cats. These carry germs that can cause birth defects in the baby.  Take your prenatal vitamins.  Try taking a stool softener (if your caregiver approves) if you develop constipation. Eat more high-fiber foods, such as fresh vegetables or fruit and whole grains. Drink plenty of fluids to keep your urine clear or pale yellow.  Take warm sitz baths to soothe any pain or discomfort caused by hemorrhoids. Use hemorrhoid cream if your caregiver approves.  If you develop varicose veins, wear support hose. Elevate your feet for 15 minutes, 3-4 times a day. Limit salt in your diet.  Avoid heavy lifting, wear low heal shoes, and practice good posture.  Rest a lot with your legs elevated if you have leg cramps or low back pain.  Visit your dentist if you have not gone during your pregnancy. Use a soft toothbrush to brush your teeth and be gentle when you floss.  A sexual relationship may be continued unless your caregiver directs you otherwise.  Do not travel far distances unless it is absolutely necessary and only with the approval  of your caregiver.  Take prenatal classes to understand, practice, and ask questions about the labor and delivery.  Make a trial run to the hospital.  Pack your hospital bag.  Prepare the baby's nursery.  Continue to go to all your prenatal visits as directed by your caregiver. SEEK MEDICAL CARE IF:  You are unsure if you are in labor or if your water has broken.  You have dizziness.  You have mild pelvic cramps, pelvic pressure, or nagging pain in your abdominal area.  You have persistent nausea, vomiting, or diarrhea.  You have a bad smelling vaginal discharge.  You have pain with urination. SEEK IMMEDIATE MEDICAL CARE IF:   You have a fever.  You are leaking fluid from your vagina.  You have spotting or bleeding from your vagina.  You have severe abdominal cramping or pain.  You have rapid weight loss or gain.  You have shortness of breath with chest pain.  You notice sudden or extreme swelling   of your face, hands, ankles, feet, or legs.  You have not felt your baby move in over an hour.  You have severe headaches that do not go away with medicine.  You have vision changes. Document Released: 09/28/2001 Document Revised: 10/09/2013 Document Reviewed: 12/05/2012 [employer] Patient Information 2015 [employer]. This information is not intended to replace advice given to you by your health care provider. Make sure you discuss any questions you have with your health care provider.  

## 2014-08-19 ENCOUNTER — Encounter (HOSPITAL_COMMUNITY): Payer: Self-pay | Admitting: *Deleted

## 2014-08-21 ENCOUNTER — Encounter: Payer: Self-pay | Admitting: *Deleted

## 2014-08-25 ENCOUNTER — Encounter (HOSPITAL_COMMUNITY): Payer: Self-pay

## 2014-08-25 ENCOUNTER — Inpatient Hospital Stay (HOSPITAL_COMMUNITY)
Admission: AD | Admit: 2014-08-25 | Discharge: 2014-08-25 | Disposition: A | Discharge status: Home / Self Care | Payer: BC Managed Care – PPO | Source: Ambulatory Visit | Attending: Family Medicine | Admitting: Family Medicine

## 2014-08-25 DIAGNOSIS — R102 Pelvic and perineal pain: Secondary | ICD-10-CM | POA: Insufficient documentation

## 2014-08-25 DIAGNOSIS — M549 Dorsalgia, unspecified: Secondary | ICD-10-CM | POA: Insufficient documentation

## 2014-08-25 DIAGNOSIS — Z3A34 34 weeks gestation of pregnancy: Secondary | ICD-10-CM | POA: Insufficient documentation

## 2014-08-25 DIAGNOSIS — O26899 Other specified pregnancy related conditions, unspecified trimester: Secondary | ICD-10-CM

## 2014-08-25 DIAGNOSIS — O26893 Other specified pregnancy related conditions, third trimester: Secondary | ICD-10-CM | POA: Diagnosis not present

## 2014-08-25 DIAGNOSIS — Z87891 Personal history of nicotine dependence: Secondary | ICD-10-CM | POA: Diagnosis not present

## 2014-08-25 DIAGNOSIS — O26892 Other specified pregnancy related conditions, second trimester: Secondary | ICD-10-CM

## 2014-08-25 DIAGNOSIS — R109 Unspecified abdominal pain: Secondary | ICD-10-CM

## 2014-08-25 LAB — URINALYSIS, ROUTINE W REFLEX MICROSCOPIC
BILIRUBIN URINE: NEGATIVE
Glucose, UA: NEGATIVE mg/dL
Hgb urine dipstick: NEGATIVE
KETONES UR: NEGATIVE mg/dL
Leukocytes, UA: NEGATIVE
Nitrite: NEGATIVE
PH: 6 (ref 5.0–8.0)
Protein, ur: NEGATIVE mg/dL
Specific Gravity, Urine: 1.015 (ref 1.005–1.030)
UROBILINOGEN UA: 1 mg/dL (ref 0.0–1.0)

## 2014-08-25 NOTE — MAU Note (Signed)
Pt presents complaining of back pain and pelvic pressure. States her pain is constant and rates it 9/10. Denies contractions, vaginal bleeding or discharge or leaking. Reports good fetal movement.

## 2014-08-25 NOTE — MAU Provider Note (Signed)
  History   [id number] at 34.2 in with c/o ongoing low abd pain and back pain. Prev c/s.  CSN: [id number]  Arrival date and time: 08/25/14 1812   None     Chief Complaint  Patient presents with  . Back Pain   HPI  OB History    Gravida Para Term Preterm AB TAB SAB Ectopic Multiple Living   5 3 3  1 1    3       Past Medical History  Diagnosis Date  . Ovarian cyst     Past Surgical History  Procedure Laterality Date  . Cesarean section    . Induced abortion      Family History  Problem Relation Age of Onset  . Hypertension Mother   . Sickle cell anemia Sister   . Diabetes Maternal Aunt   . Hypertension Maternal Aunt   . Stroke Maternal Grandmother   . Asthma Maternal Grandmother   . Hypertension Maternal Grandmother   . Hyperlipidemia Maternal Grandmother   . Heart disease Maternal Grandmother     History  Substance Use Topics  . Smoking status: Former Games developermoker  . Smokeless tobacco: Never Used  . Alcohol Use: No    Allergies: No Known Allergies  Prescriptions prior to admission  Medication Sig Dispense Refill Last Dose  . cyclobenzaprine (FLEXERIL) 10 MG tablet Take 1 tablet (10 mg total) by mouth 3 (three) times daily as needed for muscle spasms. 30 tablet 2 Taking  . Prenat w/o A Vit-FeFum-FePo-FA (CONCEPT OB) 130-92.4-1 MG CAPS Take 1 tablet by mouth daily. 30 capsule 12 Taking    Review of Systems  Constitutional: Negative.   HENT: Negative.   Eyes: Negative.   Respiratory: Negative.   Cardiovascular: Negative.   Gastrointestinal: Positive for abdominal pain.  Genitourinary: Negative.   Musculoskeletal: Positive for back pain.  Skin: Negative.   Neurological: Negative.   Endo/Heme/Allergies: Negative.   Psychiatric/Behavioral: Negative.    Physical Exam   Blood pressure 118/68, pulse 102, temperature 98 F (36.7 C), temperature source Oral, resp. rate 18, last menstrual period 01/09/2014.  Physical Exam  Constitutional: She is oriented to  person, place, and time. She appears well-developed and well-nourished.  HENT:  Head: Normocephalic.  Eyes: Pupils are equal, round, and reactive to light.  Neck: Normal range of motion.  Cardiovascular: Normal rate, regular rhythm, normal heart sounds and intact distal pulses.   Respiratory: Effort normal and breath sounds normal.  GI: Soft. Bowel sounds are normal.  Genitourinary: Vagina normal and uterus normal.  Musculoskeletal: Normal range of motion.  Neurological: She is alert and oriented to person, place, and time. She has normal reflexes.  Skin: Skin is warm and dry.  Psychiatric: She has a normal mood and affect. Her behavior is normal. Judgment and thought content normal.    MAU Course  Procedures  MDM Low abd pain. Round ligament pain.  Assessment and Plan  Round ligament pain.  Wyvonnia DuskyLAWSON, [name] DARLENE 08/25/2014, 7:03 PM

## 2014-08-25 NOTE — Progress Notes (Signed)
Notified of pt arrival in MAU and complaint. Will come see pt 

## 2014-08-25 NOTE — Discharge Instructions (Signed)
Braxton Hicks Contractions °Contractions of the uterus can occur throughout pregnancy. Contractions are not always a sign that you are in labor.  °WHAT ARE BRAXTON HICKS CONTRACTIONS?  °Contractions that occur before labor are called Braxton Hicks contractions, or false labor. Toward the end of pregnancy (32-34 weeks), these contractions can develop more often and may become more forceful. This is not true labor because these contractions do not result in opening (dilatation) and thinning of the cervix. They are sometimes difficult to tell apart from true labor because these contractions can be forceful and people have different pain tolerances. You should not feel embarrassed if you go to the hospital with false labor. Sometimes, the only way to tell if you are in true labor is for your health care provider to look for changes in the cervix. °If there are no prenatal problems or other health problems associated with the pregnancy, it is completely safe to be sent home with false labor and await the onset of true labor. °HOW CAN YOU TELL THE DIFFERENCE BETWEEN TRUE AND FALSE LABOR? °False Labor °· The contractions of false labor are usually shorter and not as hard as those of true labor.   °· The contractions are usually irregular.   °· The contractions are often felt in the front of the lower abdomen and in the groin.   °· The contractions may go away when you walk around or change positions while lying down.   °· The contractions get weaker and are shorter lasting as time goes on.   °· The contractions do not usually become progressively stronger, regular, and closer together as with true labor.   °True Labor °· Contractions in true labor last 30-70 seconds, become very regular, usually become more intense, and increase in frequency.   °· The contractions do not go away with walking.   °· The discomfort is usually felt in the top of the uterus and spreads to the lower abdomen and low back.   °· True labor can be  determined by your health care provider with an exam. This will show that the cervix is dilating and getting thinner.   °WHAT TO REMEMBER °· Keep up with your usual exercises and follow other instructions given by your health care provider.   °· Take medicines as directed by your health care provider.   °· Keep your regular prenatal appointments.   °· Eat and drink lightly if you think you are going into labor.   °· If Braxton Hicks contractions are making you uncomfortable:   °¨ Change your position from lying down or resting to walking, or from walking to resting.   °¨ Sit and rest in a tub of warm water.   °¨ Drink 2-3 glasses of water. Dehydration may cause these contractions.   °¨ Do slow and deep breathing several times an hour.   °WHEN SHOULD I SEEK IMMEDIATE MEDICAL CARE? °Seek immediate medical care if: °· Your contractions become stronger, more regular, and closer together.   °· You have fluid leaking or gushing from your vagina.   °· You have a fever.   °· You pass blood-tinged mucus.   °· You have vaginal bleeding.   °· You have continuous abdominal pain.   °· You have low back pain that you never had before.   °· You feel your baby's head pushing down and causing pelvic pressure.   °· Your baby is not moving as much as it used to.   °Document Released: 10/04/2005 Document Revised: 10/09/2013 Document Reviewed: 07/16/2013 °Exit[employer]® Patient Information ©2015 [employer]. This information is not intended to replace advice given to you by your health care   provider. Make sure you discuss any questions you have with your health care provider. °Fetal Movement Counts °Patient Name: __________________________________________________ Patient Due Date: ____________________ °Performing a fetal movement count is highly recommended in high-risk pregnancies, but it is good for every pregnant woman to do. Your health care provider may ask you to start counting fetal movements at 28 weeks of the pregnancy. Fetal  movements often increase: °· After eating a full meal. °· After physical activity. °· After eating or drinking something sweet or cold. °· At rest. °Pay attention to when you feel the baby is most active. This will help you notice a pattern of your baby's sleep and wake cycles and what factors contribute to an increase in fetal movement. It is important to perform a fetal movement count at the same time each day when your baby is normally most active.  °HOW TO COUNT FETAL MOVEMENTS °· Find a quiet and comfortable area to sit or lie down on your left side. Lying on your left side provides the best blood and oxygen circulation to your baby. °· Write down the day and time on a sheet of paper or in a journal. °· Start counting kicks, flutters, swishes, rolls, or jabs in a 2-hour period. You should feel at least 10 movements within 2 hours. °· If you do not feel 10 movements in 2 hours, wait 2-3 hours and count again. Look for a change in the pattern or not enough counts in 2 hours. °SEEK MEDICAL CARE IF: °· You feel less than 10 counts in 2 hours, tried twice. °· There is no movement in over an hour. °· The pattern is changing or taking longer each day to reach 10 counts in 2 hours. °· You feel the baby is not moving as he or she usually does. °Date: ____________ Movements: ____________ Start time: ____________ Finish time: ____________  °Date: ____________ Movements: ____________ Start time: ____________ Finish time: ____________ °Date: ____________ Movements: ____________ Start time: ____________ Finish time: ____________ °Date: ____________ Movements: ____________ Start time: ____________ Finish time: ____________ °Date: ____________ Movements: ____________ Start time: ____________ Finish time: ____________ °Date: ____________ Movements: ____________ Start time: ____________ Finish time: ____________ °Date: ____________ Movements: ____________ Start time: ____________ Finish time: ____________ °Date: ____________  Movements: ____________ Start time: ____________ Finish time: ____________  °Date: ____________ Movements: ____________ Start time: ____________ Finish time: ____________ °Date: ____________ Movements: ____________ Start time: ____________ Finish time: ____________ °Date: ____________ Movements: ____________ Start time: ____________ Finish time: ____________ °Date: ____________ Movements: ____________ Start time: ____________ Finish time: ____________ °Date: ____________ Movements: ____________ Start time: ____________ Finish time: ____________ °Date: ____________ Movements: ____________ Start time: ____________ Finish time: ____________ °Date: ____________ Movements: ____________ Start time: ____________ Finish time: ____________  °Date: ____________ Movements: ____________ Start time: ____________ Finish time: ____________ °Date: ____________ Movements: ____________ Start time: ____________ Finish time: ____________ °Date: ____________ Movements: ____________ Start time: ____________ Finish time: ____________ °Date: ____________ Movements: ____________ Start time: ____________ Finish time: ____________ °Date: ____________ Movements: ____________ Start time: ____________ Finish time: ____________ °Date: ____________ Movements: ____________ Start time: ____________ Finish time: ____________ °Date: ____________ Movements: ____________ Start time: ____________ Finish time: ____________  °Date: ____________ Movements: ____________ Start time: ____________ Finish time: ____________ °Date: ____________ Movements: ____________ Start time: ____________ Finish time: ____________ °Date: ____________ Movements: ____________ Start time: ____________ Finish time: ____________ °Date: ____________ Movements: ____________ Start time: ____________ Finish time: ____________ °Date: ____________ Movements: ____________ Start time: ____________ Finish time: ____________ °Date: ____________ Movements: ____________ Start time:  ____________ Finish time: ____________ °Date: ____________ Movements: ____________   Start time: ____________ Doreatha MartinFinish time: ____________  Date: ____________ Movements: ____________ Start time: ____________ Doreatha MartinFinish time: ____________ Date: ____________ Movements: ____________ Start time: ____________ Doreatha MartinFinish time: ____________ Date: ____________ Movements: ____________ Start time: ____________ Doreatha MartinFinish time: ____________ Date: ____________ Movements: ____________ Start time: ____________ Doreatha MartinFinish time: ____________ Date: ____________ Movements: ____________ Start time: ____________ Doreatha MartinFinish time: ____________ Date: ____________ Movements: ____________ Start time: ____________ Doreatha MartinFinish time: ____________ Date: ____________ Movements: ____________ Start time: ____________ Doreatha MartinFinish time: ____________  Date: ____________ Movements: ____________ Start time: ____________ Doreatha MartinFinish time: ____________ Date: ____________ Movements: ____________ Start time: ____________ Doreatha MartinFinish time: ____________ Date: ____________ Movements: ____________ Start time: ____________ Doreatha MartinFinish time: ____________ Date: ____________ Movements: ____________ Start time: ____________ Doreatha MartinFinish time: ____________ Date: ____________ Movements: ____________ Start time: ____________ Doreatha MartinFinish time: ____________ Date: ____________ Movements: ____________ Start time: ____________ Doreatha MartinFinish time: ____________ Date: ____________ Movements: ____________ Start time: ____________ Doreatha MartinFinish time: ____________  Date: ____________ Movements: ____________ Start time: ____________ Doreatha MartinFinish time: ____________ Date: ____________ Movements: ____________ Start time: ____________ Doreatha MartinFinish time: ____________ Date: ____________ Movements: ____________ Start time: ____________ Doreatha MartinFinish time: ____________ Date: ____________ Movements: ____________ Start time: ____________ Doreatha MartinFinish time: ____________ Date: ____________ Movements: ____________ Start time: ____________ Doreatha MartinFinish time: ____________ Date:  ____________ Movements: ____________ Start time: ____________ Doreatha MartinFinish time: ____________ Date: ____________ Movements: ____________ Start time: ____________ Doreatha MartinFinish time: ____________  Date: ____________ Movements: ____________ Start time: ____________ Doreatha MartinFinish time: ____________ Date: ____________ Movements: ____________ Start time: ____________ Doreatha MartinFinish time: ____________ Date: ____________ Movements: ____________ Start time: ____________ Doreatha MartinFinish time: ____________ Date: ____________ Movements: ____________ Start time: ____________ Doreatha MartinFinish time: ____________ Date: ____________ Movements: ____________ Start time: ____________ Doreatha MartinFinish time: ____________ Date: ____________ Movements: ____________ Start time: ____________ Doreatha MartinFinish time: ____________ Document Released: 11/03/2006 Document Revised: 02/18/2014 Document Reviewed: 07/31/2012 [employer] Patient Information 2015 [place]. This information is not intended to replace advice given to you by your health care provider. Make sure you discuss any questions you have with your health care provider. Third Trimester of Pregnancy The third trimester is from week 29 through week 42, months 7 through 9. The third trimester is a time when the fetus is growing rapidly. At the end of the ninth month, the fetus is about 20 inches in length and weighs 6-10 pounds.  BODY CHANGES Your body goes through many changes during pregnancy. The changes vary from woman to woman.   Your weight will continue to increase. You can expect to gain 25-35 pounds (11-16 kg) by the end of the pregnancy.  You may begin to get stretch marks on your hips, abdomen, and breasts.  You may urinate more often because the fetus is moving lower into your pelvis and pressing on your bladder.  You may develop or continue to have heartburn as a result of your pregnancy.  You may develop constipation because certain hormones are causing the muscles that push waste through your intestines to  slow down.  You may develop hemorrhoids or swollen, bulging veins (varicose veins).  You may have pelvic pain because of the weight gain and pregnancy hormones relaxing your joints between the bones in your pelvis. Backaches may result from overexertion of the muscles supporting your posture.  You may have changes in your hair. These can include thickening of your hair, rapid growth, and changes in texture. Some women also have hair loss during or after pregnancy, or hair that feels dry or thin. Your hair will most likely return to normal after your baby is born.  Your breasts will continue to grow and be tender. A yellow discharge may leak from your breasts  called colostrum.  Your belly button may stick out.  You may feel short of breath because of your expanding uterus.  You may notice the fetus "dropping," or moving lower in your abdomen.  You may have a bloody mucus discharge. This usually occurs a few days to a week before labor begins.  Your cervix becomes thin and soft (effaced) near your due date. WHAT TO EXPECT AT YOUR PRENATAL EXAMS  You will have prenatal exams every 2 weeks until week 36. Then, you will have weekly prenatal exams. During a routine prenatal visit:  You will be weighed to make sure you and the fetus are growing normally.  Your blood pressure is taken.  Your abdomen will be measured to track your baby's growth.  The fetal heartbeat will be listened to.  Any test results from the previous visit will be discussed.  You may have a cervical check near your due date to see if you have effaced. At around 36 weeks, your caregiver will check your cervix. At the same time, your caregiver will also perform a test on the secretions of the vaginal tissue. This test is to determine if a type of bacteria, Group B streptococcus, is present. Your caregiver will explain this further. Your caregiver may ask you:  What your birth plan is.  How you are feeling.  If you  are feeling the baby move.  If you have had any abnormal symptoms, such as leaking fluid, bleeding, severe headaches, or abdominal cramping.  If you have any questions. Other tests or screenings that may be performed during your third trimester include:  Blood tests that check for low iron levels (anemia).  Fetal testing to check the health, activity level, and growth of the fetus. Testing is done if you have certain medical conditions or if there are problems during the pregnancy. FALSE LABOR You may feel small, irregular contractions that eventually go away. These are called Braxton Hicks contractions, or false labor. Contractions may last for hours, days, or even weeks before true labor sets in. If contractions come at regular intervals, intensify, or become painful, it is best to be seen by your caregiver.  SIGNS OF LABOR   Menstrual-like cramps.  Contractions that are 5 minutes apart or less.  Contractions that start on the top of the uterus and spread down to the lower abdomen and back.  A sense of increased pelvic pressure or back pain.  A watery or bloody mucus discharge that comes from the vagina. If you have any of these signs before the 37th week of pregnancy, call your caregiver right away. You need to go to the hospital to get checked immediately. HOME CARE INSTRUCTIONS   Avoid all smoking, herbs, alcohol, and unprescribed drugs. These chemicals affect the formation and growth of the baby.  Follow your caregiver's instructions regarding medicine use. There are medicines that are either safe or unsafe to take during pregnancy.  Exercise only as directed by your caregiver. Experiencing uterine cramps is a good sign to stop exercising.  Continue to eat regular, healthy meals.  Wear a good support bra for breast tenderness.  Do not use hot tubs, steam rooms, or saunas.  Wear your seat belt at all times when driving.  Avoid raw meat, uncooked cheese, cat litter boxes,  and soil used by cats. These carry germs that can cause birth defects in the baby.  Take your prenatal vitamins.  Try taking a stool softener (if your caregiver approves) if you develop  constipation. Eat more high-fiber foods, such as fresh vegetables or fruit and whole grains. Drink plenty of fluids to keep your urine clear or pale yellow.  Take warm sitz baths to soothe any pain or discomfort caused by hemorrhoids. Use hemorrhoid cream if your caregiver approves.  If you develop varicose veins, wear support hose. Elevate your feet for 15 minutes, 3-4 times a day. Limit salt in your diet.  Avoid heavy lifting, wear low heal shoes, and practice good posture.  Rest a lot with your legs elevated if you have leg cramps or low back pain.  Visit your dentist if you have not gone during your pregnancy. Use a soft toothbrush to brush your teeth and be gentle when you floss.  A sexual relationship may be continued unless your caregiver directs you otherwise.  Do not travel far distances unless it is absolutely necessary and only with the approval of your caregiver.  Take prenatal classes to understand, practice, and ask questions about the labor and delivery.  Make a trial run to the hospital.  Pack your hospital bag.  Prepare the baby's nursery.  Continue to go to all your prenatal visits as directed by your caregiver. SEEK MEDICAL CARE IF:  You are unsure if you are in labor or if your water has broken.  You have dizziness.  You have mild pelvic cramps, pelvic pressure, or nagging pain in your abdominal area.  You have persistent nausea, vomiting, or diarrhea.  You have a bad smelling vaginal discharge.  You have pain with urination. SEEK IMMEDIATE MEDICAL CARE IF:   You have a fever.  You are leaking fluid from your vagina.  You have spotting or bleeding from your vagina.  You have severe abdominal cramping or pain.  You have rapid weight loss or gain.  You have  shortness of breath with chest pain.  You notice sudden or extreme swelling of your face, hands, ankles, feet, or legs.  You have not felt your baby move in over an hour.  You have severe headaches that do not go away with medicine.  You have vision changes. Document Released: 09/28/2001 Document Revised: 10/09/2013 Document Reviewed: 12/05/2012 [facility] Patient Information 2015 [place], [place]. This information is not intended to replace advice given to you by your health care provider. Make sure you discuss any questions you have with your health care provider.

## 2014-08-29 ENCOUNTER — Encounter: Payer: Self-pay | Admitting: Advanced Practice Midwife

## 2014-08-29 ENCOUNTER — Ambulatory Visit (INDEPENDENT_AMBULATORY_CARE_PROVIDER_SITE_OTHER): Payer: Medicaid Other | Admitting: Advanced Practice Midwife

## 2014-08-29 ENCOUNTER — Encounter: Payer: Self-pay | Admitting: Obstetrics and Gynecology

## 2014-08-29 ENCOUNTER — Encounter: Payer: Self-pay | Admitting: Family Medicine

## 2014-08-29 VITALS — BP 118/64 | HR 108 | Temp 98.1°F | Wt 197.2 lb

## 2014-08-29 DIAGNOSIS — Z3493 Encounter for supervision of normal pregnancy, unspecified, third trimester: Secondary | ICD-10-CM

## 2014-08-29 LAB — POCT URINALYSIS DIP (DEVICE)
Bilirubin Urine: NEGATIVE
Glucose, UA: NEGATIVE mg/dL
KETONES UR: NEGATIVE mg/dL
Leukocytes, UA: NEGATIVE
Nitrite: NEGATIVE
PH: 6 (ref 5.0–8.0)
Protein, ur: NEGATIVE mg/dL
Specific Gravity, Urine: 1.02 (ref 1.005–1.030)
UROBILINOGEN UA: 0.2 mg/dL (ref 0.0–1.0)

## 2014-08-29 NOTE — Progress Notes (Signed)
Patient reports lower back pain and pain at previous c/s site, feels like it's ripping open

## 2014-08-29 NOTE — Patient Instructions (Signed)
Third Trimester of Pregnancy The third trimester is from week 29 through week 42, months 7 through 9. The third trimester is a time when the fetus is growing rapidly. At the end of the ninth month, the fetus is about 20 inches in length and weighs 6-10 pounds.  BODY CHANGES Your body goes through many changes during pregnancy. The changes vary from woman to woman.   Your weight will continue to increase. You can expect to gain 25-35 pounds (11-16 kg) by the end of the pregnancy.  You may begin to get stretch marks on your hips, abdomen, and breasts.  You may urinate more often because the fetus is moving lower into your pelvis and pressing on your bladder.  You may develop or continue to have heartburn as a result of your pregnancy.  You may develop constipation because certain hormones are causing the muscles that push waste through your intestines to slow down.  You may develop hemorrhoids or swollen, bulging veins (varicose veins).  You may have pelvic pain because of the weight gain and pregnancy hormones relaxing your joints between the bones in your pelvis. Backaches may result from overexertion of the muscles supporting your posture.  You may have changes in your hair. These can include thickening of your hair, rapid growth, and changes in texture. Some women also have hair loss during or after pregnancy, or hair that feels dry or thin. Your hair will most likely return to normal after your baby is born.  Your breasts will continue to grow and be tender. A yellow discharge may leak from your breasts called colostrum.  Your belly button may stick out.  You may feel short of breath because of your expanding uterus.  You may notice the fetus "dropping," or moving lower in your abdomen.  You may have a bloody mucus discharge. This usually occurs a few days to a week before labor begins.  Your cervix becomes thin and soft (effaced) near your due date. WHAT TO EXPECT AT YOUR PRENATAL  EXAMS  You will have prenatal exams every 2 weeks until week 36. Then, you will have weekly prenatal exams. During a routine prenatal visit:  You will be weighed to make sure you and the fetus are growing normally.  Your blood pressure is taken.  Your abdomen will be measured to track your baby's growth.  The fetal heartbeat will be listened to.  Any test results from the previous visit will be discussed.  You may have a cervical check near your due date to see if you have effaced. At around 36 weeks, your caregiver will check your cervix. At the same time, your caregiver will also perform a test on the secretions of the vaginal tissue. This test is to determine if a type of bacteria, Group B streptococcus, is present. Your caregiver will explain this further. Your caregiver may ask you:  What your birth plan is.  How you are feeling.  If you are feeling the baby move.  If you have had any abnormal symptoms, such as leaking fluid, bleeding, severe headaches, or abdominal cramping.  If you have any questions. Other tests or screenings that may be performed during your third trimester include:  Blood tests that check for low iron levels (anemia).  Fetal testing to check the health, activity level, and growth of the fetus. Testing is done if you have certain medical conditions or if there are problems during the pregnancy. FALSE LABOR You may feel small, irregular contractions that   eventually go away. These are called Braxton Hicks contractions, or false labor. Contractions may last for hours, days, or even weeks before true labor sets in. If contractions come at regular intervals, intensify, or become painful, it is best to be seen by your caregiver.  SIGNS OF LABOR   Menstrual-like cramps.  Contractions that are 5 minutes apart or less.  Contractions that start on the top of the uterus and spread down to the lower abdomen and back.  A sense of increased pelvic pressure or back  pain.  A watery or bloody mucus discharge that comes from the vagina. If you have any of these signs before the 37th week of pregnancy, call your caregiver right away. You need to go to the hospital to get checked immediately. HOME CARE INSTRUCTIONS   Avoid all smoking, herbs, alcohol, and unprescribed drugs. These chemicals affect the formation and growth of the baby.  Follow your caregiver's instructions regarding medicine use. There are medicines that are either safe or unsafe to take during pregnancy.  Exercise only as directed by your caregiver. Experiencing uterine cramps is a good sign to stop exercising.  Continue to eat regular, healthy meals.  Wear a good support bra for breast tenderness.  Do not use hot tubs, steam rooms, or saunas.  Wear your seat belt at all times when driving.  Avoid raw meat, uncooked cheese, cat litter boxes, and soil used by cats. These carry germs that can cause birth defects in the baby.  Take your prenatal vitamins.  Try taking a stool softener (if your caregiver approves) if you develop constipation. Eat more high-fiber foods, such as fresh vegetables or fruit and whole grains. Drink plenty of fluids to keep your urine clear or pale yellow.  Take warm sitz baths to soothe any pain or discomfort caused by hemorrhoids. Use hemorrhoid cream if your caregiver approves.  If you develop varicose veins, wear support hose. Elevate your feet for 15 minutes, 3-4 times a day. Limit salt in your diet.  Avoid heavy lifting, wear low heal shoes, and practice good posture.  Rest a lot with your legs elevated if you have leg cramps or low back pain.  Visit your dentist if you have not gone during your pregnancy. Use a soft toothbrush to brush your teeth and be gentle when you floss.  A sexual relationship may be continued unless your caregiver directs you otherwise.  Do not travel far distances unless it is absolutely necessary and only with the approval  of your caregiver.  Take prenatal classes to understand, practice, and ask questions about the labor and delivery.  Make a trial run to the hospital.  Pack your hospital bag.  Prepare the baby's nursery.  Continue to go to all your prenatal visits as directed by your caregiver. SEEK MEDICAL CARE IF:  You are unsure if you are in labor or if your water has broken.  You have dizziness.  You have mild pelvic cramps, pelvic pressure, or nagging pain in your abdominal area.  You have persistent nausea, vomiting, or diarrhea.  You have a bad smelling vaginal discharge.  You have pain with urination. SEEK IMMEDIATE MEDICAL CARE IF:   You have a fever.  You are leaking fluid from your vagina.  You have spotting or bleeding from your vagina.  You have severe abdominal cramping or pain.  You have rapid weight loss or gain.  You have shortness of breath with chest pain.  You notice sudden or extreme swelling   of your face, hands, ankles, feet, or legs.  You have not felt your baby move in over an hour.  You have severe headaches that do not go away with medicine.  You have vision changes. Document Released: 09/28/2001 Document Revised: 10/09/2013 Document Reviewed: 12/05/2012 [employer] Patient Information 2015 [employer]. This information is not intended to replace advice given to you by your health care provider. Make sure you discuss any questions you have with your health care provider.  

## 2014-08-29 NOTE — Progress Notes (Signed)
States feels tired. Plan cultures at next visit. Denies UCs or leaking.

## 2014-09-10 ENCOUNTER — Ambulatory Visit (INDEPENDENT_AMBULATORY_CARE_PROVIDER_SITE_OTHER): Payer: Medicaid Other | Admitting: Obstetrics and Gynecology

## 2014-09-10 ENCOUNTER — Ambulatory Visit: Payer: Medicaid Other | Admitting: Obstetrics and Gynecology

## 2014-09-10 VITALS — BP 122/74 | HR 105 | Temp 98.4°F | Wt 202.8 lb

## 2014-09-10 DIAGNOSIS — Z3493 Encounter for supervision of normal pregnancy, unspecified, third trimester: Secondary | ICD-10-CM

## 2014-09-10 DIAGNOSIS — O3421 Maternal care for scar from previous cesarean delivery: Secondary | ICD-10-CM

## 2014-09-10 DIAGNOSIS — O34219 Maternal care for unspecified type scar from previous cesarean delivery: Secondary | ICD-10-CM

## 2014-09-10 LAB — POCT URINALYSIS DIP (DEVICE)
BILIRUBIN URINE: NEGATIVE
Glucose, UA: NEGATIVE mg/dL
KETONES UR: NEGATIVE mg/dL
Leukocytes, UA: NEGATIVE
Nitrite: NEGATIVE
PROTEIN: NEGATIVE mg/dL
Specific Gravity, Urine: 1.015 (ref 1.005–1.030)
Urobilinogen, UA: 0.2 mg/dL (ref 0.0–1.0)
pH: 6.5 (ref 5.0–8.0)

## 2014-09-10 NOTE — Progress Notes (Signed)
Reports intermittent abdominal pains and occasional contractions.  Occasional swelling in feet.

## 2014-09-10 NOTE — Progress Notes (Signed)
RC/S and BTL for 08/28/14. Plans breast and bottle, outside circ. Cultures done.

## 2014-09-10 NOTE — Patient Instructions (Signed)
Third Trimester of Pregnancy The third trimester is from week 29 through week 42, months 7 through 9. The third trimester is a time when the fetus is growing rapidly. At the end of the ninth month, the fetus is about 20 inches in length and weighs 6-10 pounds.  BODY CHANGES Your body goes through many changes during pregnancy. The changes vary from woman to woman.   Your weight will continue to increase. You can expect to gain 25-35 pounds (11-16 kg) by the end of the pregnancy.  You may begin to get stretch marks on your hips, abdomen, and breasts.  You may urinate more often because the fetus is moving lower into your pelvis and pressing on your bladder.  You may develop or continue to have heartburn as a result of your pregnancy.  You may develop constipation because certain hormones are causing the muscles that push waste through your intestines to slow down.  You may develop hemorrhoids or swollen, bulging veins (varicose veins).  You may have pelvic pain because of the weight gain and pregnancy hormones relaxing your joints between the bones in your pelvis. Backaches may result from overexertion of the muscles supporting your posture.  You may have changes in your hair. These can include thickening of your hair, rapid growth, and changes in texture. Some women also have hair loss during or after pregnancy, or hair that feels dry or thin. Your hair will most likely return to normal after your baby is born.  Your breasts will continue to grow and be tender. A yellow discharge may leak from your breasts called colostrum.  Your belly button may stick out.  You may feel short of breath because of your expanding uterus.  You may notice the fetus "dropping," or moving lower in your abdomen.  You may have a bloody mucus discharge. This usually occurs a few days to a week before labor begins.  Your cervix becomes thin and soft (effaced) near your due date. WHAT TO EXPECT AT YOUR PRENATAL  EXAMS  You will have prenatal exams every 2 weeks until week 36. Then, you will have weekly prenatal exams. During a routine prenatal visit:  You will be weighed to make sure you and the fetus are growing normally.  Your blood pressure is taken.  Your abdomen will be measured to track your baby's growth.  The fetal heartbeat will be listened to.  Any test results from the previous visit will be discussed.  You may have a cervical check near your due date to see if you have effaced. At around 36 weeks, your caregiver will check your cervix. At the same time, your caregiver will also perform a test on the secretions of the vaginal tissue. This test is to determine if a type of bacteria, Group B streptococcus, is present. Your caregiver will explain this further. Your caregiver may ask you:  What your birth plan is.  How you are feeling.  If you are feeling the baby move.  If you have had any abnormal symptoms, such as leaking fluid, bleeding, severe headaches, or abdominal cramping.  If you have any questions. Other tests or screenings that may be performed during your third trimester include:  Blood tests that check for low iron levels (anemia).  Fetal testing to check the health, activity level, and growth of the fetus. Testing is done if you have certain medical conditions or if there are problems during the pregnancy. FALSE LABOR You may feel small, irregular contractions that   eventually go away. These are called Braxton Hicks contractions, or false labor. Contractions may last for hours, days, or even weeks before true labor sets in. If contractions come at regular intervals, intensify, or become painful, it is best to be seen by your caregiver.  SIGNS OF LABOR   Menstrual-like cramps.  Contractions that are 5 minutes apart or less.  Contractions that start on the top of the uterus and spread down to the lower abdomen and back.  A sense of increased pelvic pressure or back  pain.  A watery or bloody mucus discharge that comes from the vagina. If you have any of these signs before the 37th week of pregnancy, call your caregiver right away. You need to go to the hospital to get checked immediately. HOME CARE INSTRUCTIONS   Avoid all smoking, herbs, alcohol, and unprescribed drugs. These chemicals affect the formation and growth of the baby.  Follow your caregiver's instructions regarding medicine use. There are medicines that are either safe or unsafe to take during pregnancy.  Exercise only as directed by your caregiver. Experiencing uterine cramps is a good sign to stop exercising.  Continue to eat regular, healthy meals.  Wear a good support bra for breast tenderness.  Do not use hot tubs, steam rooms, or saunas.  Wear your seat belt at all times when driving.  Avoid raw meat, uncooked cheese, cat litter boxes, and soil used by cats. These carry germs that can cause birth defects in the baby.  Take your prenatal vitamins.  Try taking a stool softener (if your caregiver approves) if you develop constipation. Eat more high-fiber foods, such as fresh vegetables or fruit and whole grains. Drink plenty of fluids to keep your urine clear or pale yellow.  Take warm sitz baths to soothe any pain or discomfort caused by hemorrhoids. Use hemorrhoid cream if your caregiver approves.  If you develop varicose veins, wear support hose. Elevate your feet for 15 minutes, 3-4 times a day. Limit salt in your diet.  Avoid heavy lifting, wear low heal shoes, and practice good posture.  Rest a lot with your legs elevated if you have leg cramps or low back pain.  Visit your dentist if you have not gone during your pregnancy. Use a soft toothbrush to brush your teeth and be gentle when you floss.  A sexual relationship may be continued unless your caregiver directs you otherwise.  Do not travel far distances unless it is absolutely necessary and only with the approval  of your caregiver.  Take prenatal classes to understand, practice, and ask questions about the labor and delivery.  Make a trial run to the hospital.  Pack your hospital bag.  Prepare the baby's nursery.  Continue to go to all your prenatal visits as directed by your caregiver. SEEK MEDICAL CARE IF:  You are unsure if you are in labor or if your water has broken.  You have dizziness.  You have mild pelvic cramps, pelvic pressure, or nagging pain in your abdominal area.  You have persistent nausea, vomiting, or diarrhea.  You have a bad smelling vaginal discharge.  You have pain with urination. SEEK IMMEDIATE MEDICAL CARE IF:   You have a fever.  You are leaking fluid from your vagina.  You have spotting or bleeding from your vagina.  You have severe abdominal cramping or pain.  You have rapid weight loss or gain.  You have shortness of breath with chest pain.  You notice sudden or extreme swelling   of your face, hands, ankles, feet, or legs.  You have not felt your baby move in over an hour.  You have severe headaches that do not go away with medicine.  You have vision changes. Document Released: 09/28/2001 Document Revised: 10/09/2013 Document Reviewed: 12/05/2012 [employer] Patient Information 2015 [employer]. This information is not intended to replace advice given to you by your health care provider. Make sure you discuss any questions you have with your health care provider.  

## 2014-09-10 NOTE — Patient Instructions (Signed)
Cesarean Delivery  Cesarean delivery is the birth of a baby through a cut (incision) in the abdomen and womb (uterus).  LET YOUR HEALTH CARE PROVIDER KNOW ABOUT:  All medicines you are taking, including vitamins, herbs, eye drops, creams, and over-the-counter medicines.  Previous problems you or members of your family have had with the use of anesthetics.  Any blood disorders you have.  Previous surgeries you have had.  Medical conditions you have.  Any allergies you have.  Complicationsinvolving the pregnancy. RISKS AND COMPLICATIONS  Generally, this is a safe procedure. However, as with any procedure, complications can occur. Possible complications include:  Bleeding.  Infection.  Blood clots.  Injury to surrounding organs.  Problems with anesthesia.  Injury to the baby. BEFORE THE PROCEDURE   You may be given an antacid medicine to drink. This will prevent acid contents in your stomach from going into your lungs if you vomit during the surgery.  You may be given an antibiotic medicine to prevent infection. PROCEDURE   Hair may be removed from your pubic area and your lower abdomen. This is to prevent infection in the incision site.  A tube (Foley catheter) will be placed in your bladder to drain your urine from your bladder into a bag. This keeps your bladder empty during surgery.  An IV tube will be placed in your vein.  You may be given medicine to numb the lower half of your body (regional anesthetic). If you were in labor, you may have already had an epidural in place which can be used in both labor and cesarean delivery. You may possibly be given medicine to make you sleep (general anesthetic) though this is not as common.  An incision will be made in your abdomen that extends to your uterus. There are 2 basic kinds of incisions:  The horizontal (transverse) incision. Horizontal incisions are from side to side and are used for most routine cesarean  deliveries.  The vertical incision. The vertical incision is from the top of the abdomen to the bottom and is less commonly used. It is often done for women who have a serious complication (extreme prematurity) or under emergency situations.  The horizontal and vertical incisions may both be used at the same time. However, this is very uncommon.  An incision is then made in your uterus to deliver the baby.  Your baby will then be delivered.  Both incisions are then closed with absorbable stitches. AFTER THE PROCEDURE   If you were awake during the surgery, you will see your baby right away. If you were asleep, you will see your baby as soon as you are awake.  You may breastfeed your baby after surgery.  You may be able to get up and walk the same day as the surgery. If you need to stay in bed for a period of time, you will receive help to turn, cough, and take deep breaths after surgery. This helps prevent lung problems such as pneumonia.  Do not get out of bed alone the first time after surgery. You will need help getting out of bed until you are able to do this by yourself.  You may be able to shower the day after your cesarean delivery. After the bandage (dressing) is taken off the incision site, a nurse will assist you to shower if you would like help.  You will have pneumatic compression hose placed on your lower legs. This is done to prevent blood clots. When you are up   and walking regularly, they will no longer be necessary.  Do not cross your legs when you sit.  Save any blood clots that you pass. If you pass a clot while on the toilet, do not flush it. Call for the nurse. Tell the nurse if you think you are bleeding too much or passing too many clots.  You will be given medicine as needed. Let your health care providers know if you are hurting. You may also be given an antibiotic to prevent an infection.  Your IV tube will be taken out when you are drinking a reasonable  amount of fluids. The Foley catheter is taken out when you are up and walking.  If your blood type is Rh negative and your baby's blood type is Rh positive, you will be given a shot of anti-D immune globulin. This shot prevents you from having Rh problems with a future pregnancy. You should get the shot even if you had your tubes tied (tubal ligation).  If you are allowed to take the baby for a walk, place the baby in the bassinet and push it. Do not carry your baby in your arms. Document Released: 10/04/2005 Document Revised: 07/25/2013 Document Reviewed: 04/25/2013 [employer] Patient Information 2015 [employer]. This information is not intended to replace advice given to you by your health care provider. Make sure you discuss any questions you have with your health care provider.  

## 2014-09-11 LAB — GC/CHLAMYDIA PROBE AMP
CT Probe RNA: NEGATIVE
GC Probe RNA: NEGATIVE

## 2014-09-12 LAB — CULTURE, BETA STREP (GROUP B ONLY)

## 2014-09-14 ENCOUNTER — Inpatient Hospital Stay (HOSPITAL_COMMUNITY)
Admission: AD | Admit: 2014-09-14 | Discharge: 2014-09-14 | Disposition: A | Discharge status: Home / Self Care | Payer: BC Managed Care – PPO | Source: Ambulatory Visit | Attending: Obstetrics & Gynecology | Admitting: Obstetrics & Gynecology

## 2014-09-14 ENCOUNTER — Encounter (HOSPITAL_COMMUNITY): Payer: Self-pay

## 2014-09-14 DIAGNOSIS — Z3493 Encounter for supervision of normal pregnancy, unspecified, third trimester: Secondary | ICD-10-CM

## 2014-09-14 DIAGNOSIS — O23593 Infection of other part of genital tract in pregnancy, third trimester: Secondary | ICD-10-CM | POA: Insufficient documentation

## 2014-09-14 DIAGNOSIS — Z3A37 37 weeks gestation of pregnancy: Secondary | ICD-10-CM | POA: Insufficient documentation

## 2014-09-14 DIAGNOSIS — N898 Other specified noninflammatory disorders of vagina: Secondary | ICD-10-CM

## 2014-09-14 DIAGNOSIS — N760 Acute vaginitis: Secondary | ICD-10-CM | POA: Insufficient documentation

## 2014-09-14 DIAGNOSIS — O26893 Other specified pregnancy related conditions, third trimester: Secondary | ICD-10-CM

## 2014-09-14 LAB — URINALYSIS, ROUTINE W REFLEX MICROSCOPIC
BILIRUBIN URINE: NEGATIVE
GLUCOSE, UA: 500 mg/dL — AB
HGB URINE DIPSTICK: NEGATIVE
Ketones, ur: NEGATIVE mg/dL
Leukocytes, UA: NEGATIVE
Nitrite: NEGATIVE
Protein, ur: NEGATIVE mg/dL
Urobilinogen, UA: 0.2 mg/dL (ref 0.0–1.0)
pH: 7 (ref 5.0–8.0)

## 2014-09-14 LAB — WET PREP, GENITAL
Clue Cells Wet Prep HPF POC: NONE SEEN
TRICH WET PREP: NONE SEEN
YEAST WET PREP: NONE SEEN

## 2014-09-14 MED ORDER — TERCONAZOLE 80 MG VA SUPP: 80.0000 mg | Freq: Every day | VAGINAL | Status: DC

## 2014-09-14 MED ORDER — FLUCONAZOLE 150 MG PO TABS
150.0000 mg | ORAL_TABLET | Freq: Once | ORAL | Status: AC
  Administered 2014-09-14: 150 mg via ORAL
  Filled 2014-09-14: qty 1

## 2014-09-14 NOTE — MAU Note (Signed)
Pt states here for vaginal irritation and possible yeast infection x2 days. States skin sloughs off when she washes. Otherwise no pregnancy concerns. White thick non odorous discharge, no bleeding.

## 2014-09-14 NOTE — Discharge Instructions (Signed)

## 2014-09-14 NOTE — MAU Provider Note (Signed)
  History     CSN: [id number]  Arrival date and time: 09/14/14 1033   First Provider Initiated Contact with Patient 09/14/14 1117      Chief Complaint  Patient presents with  . Vaginitis   HPI  Patient is 28 y.o. [name] 3268w1d here with complaints of vaginal irritation.  Reports that it feels as though she has a yeast infection for ~2 days. Itching, vulva burns with urination 2/2 irritation.  Thick white vaginal discharge.  +FM, denies LOF, VB.  + Braxton Hicks contractions at night and first thing in AM.    Past Medical History  Diagnosis Date  . Ovarian cyst     Past Surgical History  Procedure Laterality Date  . Cesarean section    . Induced abortion      Family History  Problem Relation Age of Onset  . Hypertension Mother   . Sickle cell anemia Sister   . Diabetes Maternal Aunt   . Hypertension Maternal Aunt   . Stroke Maternal Grandmother   . Asthma Maternal Grandmother   . Hypertension Maternal Grandmother   . Hyperlipidemia Maternal Grandmother   . Heart disease Maternal Grandmother     History  Substance Use Topics  . Smoking status: Former Games developermoker  . Smokeless tobacco: Never Used  . Alcohol Use: No    Allergies: No Known Allergies  Prescriptions prior to admission  Medication Sig Dispense Refill Last Dose  . cyclobenzaprine (FLEXERIL) 10 MG tablet Take 1 tablet (10 mg total) by mouth 3 (three) times daily as needed for muscle spasms. 30 tablet 2 Taking  . Prenat w/o A Vit-FeFum-FePo-FA (CONCEPT OB) 130-92.4-1 MG CAPS Take 1 tablet by mouth daily. 30 capsule 12 Taking    Review of Systems  Constitutional: Negative for fever, chills and weight loss.  Respiratory: Negative for cough and shortness of breath.   Cardiovascular: Negative for chest pain.  Gastrointestinal: Negative for nausea and vomiting.  Genitourinary: Negative for dysuria, urgency and frequency.  Skin: Negative for rash.  Neurological: Negative for headaches.   Physical Exam    Blood pressure 135/79, pulse 121, temperature 98.4 F (36.9 C), temperature source Oral, resp. rate 18, last menstrual period 01/09/2014.  Physical Exam  Constitutional: She is oriented to person, place, and time. She appears well-developed and well-nourished. No distress.  HENT:  Head: Normocephalic and atraumatic.  Cardiovascular: Normal rate and intact distal pulses.   Respiratory: Effort normal. No respiratory distress.  GI: There is no tenderness.  Genitourinary: Vagina normal.  + vaginal discharge (thick, white, no odor)  Musculoskeletal: She exhibits no edema or tenderness.  Neurological: She is alert and oriented to person, place, and time.  Skin: Skin is warm and dry. No rash noted.    MAU Course  Procedures  MDM FHM: Cat 1, reactive, reassuring Wet prep: mod WBCs, no yeast/clue cells/bacteria  Assessment and Plan  Patient is 28 y.o. [name] 6468w1d reporting vaginitis likely secondary to yeast infection.  History consistent with yeast vaginitis despite no yeast seen on wet prep. - fetal kick counts reinforced - preterm labor precautions - diflucan 150mg  once given in MAU - Rx for terazol suppositories - [facility]   Shirlee LatchBacigalupo, Angela 09/14/2014, 11:17 AM   I have seen and examined this patient and I agree with the above. Cam HaiSHAW, Tenee Wish CNM 2:23 PM 09/14/2014

## 2014-09-17 ENCOUNTER — Ambulatory Visit (INDEPENDENT_AMBULATORY_CARE_PROVIDER_SITE_OTHER): Payer: BC Managed Care – PPO | Admitting: Family Medicine

## 2014-09-17 VITALS — BP 116/68 | HR 127 | Temp 98.6°F | Wt 204.1 lb

## 2014-09-17 DIAGNOSIS — Z3493 Encounter for supervision of normal pregnancy, unspecified, third trimester: Secondary | ICD-10-CM

## 2014-09-17 DIAGNOSIS — O34219 Maternal care for unspecified type scar from previous cesarean delivery: Secondary | ICD-10-CM

## 2014-09-17 DIAGNOSIS — O3421 Maternal care for scar from previous cesarean delivery: Secondary | ICD-10-CM

## 2014-09-17 LAB — POCT URINALYSIS DIP (DEVICE)
BILIRUBIN URINE: NEGATIVE
GLUCOSE, UA: NEGATIVE mg/dL
Hgb urine dipstick: NEGATIVE
Ketones, ur: NEGATIVE mg/dL
Leukocytes, UA: NEGATIVE
NITRITE: NEGATIVE
Protein, ur: NEGATIVE mg/dL
Specific Gravity, Urine: <=1.005 (ref 1.005–1.030)
Urobilinogen, UA: 0.2 mg/dL (ref 0.0–1.0)
pH: 5.5 (ref 5.0–8.0)

## 2014-09-17 NOTE — Progress Notes (Signed)
C/o lower back pain and pressure in lower abdomen.

## 2014-09-17 NOTE — Progress Notes (Signed)
Some contractions.  No other complaints.  Good fetal activity.  Labor precautions given.

## 2014-09-17 NOTE — Patient Instructions (Signed)
Third Trimester of Pregnancy The third trimester is from week 29 through week 42, months 7 through 9. The third trimester is a time when the fetus is growing rapidly. At the end of the ninth month, the fetus is about 20 inches in length and weighs 6-10 pounds.  BODY CHANGES Your body goes through many changes during pregnancy. The changes vary from woman to woman.   Your weight will continue to increase. You can expect to gain 25-35 pounds (11-16 kg) by the end of the pregnancy.  You may begin to get stretch marks on your hips, abdomen, and breasts.  You may urinate more often because the fetus is moving lower into your pelvis and pressing on your bladder.  You may develop or continue to have heartburn as a result of your pregnancy.  You may develop constipation because certain hormones are causing the muscles that push waste through your intestines to slow down.  You may develop hemorrhoids or swollen, bulging veins (varicose veins).  You may have pelvic pain because of the weight gain and pregnancy hormones relaxing your joints between the bones in your pelvis. Backaches may result from overexertion of the muscles supporting your posture.  You may have changes in your hair. These can include thickening of your hair, rapid growth, and changes in texture. Some women also have hair loss during or after pregnancy, or hair that feels dry or thin. Your hair will most likely return to normal after your baby is born.  Your breasts will continue to grow and be tender. A yellow discharge may leak from your breasts called colostrum.  Your belly button may stick out.  You may feel short of breath because of your expanding uterus.  You may notice the fetus "dropping," or moving lower in your abdomen.  You may have a bloody mucus discharge. This usually occurs a few days to a week before labor begins.  Your cervix becomes thin and soft (effaced) near your due date. WHAT TO EXPECT AT YOUR PRENATAL  EXAMS  You will have prenatal exams every 2 weeks until week 36. Then, you will have weekly prenatal exams. During a routine prenatal visit:  You will be weighed to make sure you and the fetus are growing normally.  Your blood pressure is taken.  Your abdomen will be measured to track your baby's growth.  The fetal heartbeat will be listened to.  Any test results from the previous visit will be discussed.  You may have a cervical check near your due date to see if you have effaced. At around 36 weeks, your caregiver will check your cervix. At the same time, your caregiver will also perform a test on the secretions of the vaginal tissue. This test is to determine if a type of bacteria, Group B streptococcus, is present. Your caregiver will explain this further. Your caregiver may ask you:  What your birth plan is.  How you are feeling.  If you are feeling the baby move.  If you have had any abnormal symptoms, such as leaking fluid, bleeding, severe headaches, or abdominal cramping.  If you have any questions. Other tests or screenings that may be performed during your third trimester include:  Blood tests that check for low iron levels (anemia).  Fetal testing to check the health, activity level, and growth of the fetus. Testing is done if you have certain medical conditions or if there are problems during the pregnancy. FALSE LABOR You may feel small, irregular contractions that   eventually go away. These are called Braxton Hicks contractions, or false labor. Contractions may last for hours, days, or even weeks before true labor sets in. If contractions come at regular intervals, intensify, or become painful, it is best to be seen by your caregiver.  SIGNS OF LABOR   Menstrual-like cramps.  Contractions that are 5 minutes apart or less.  Contractions that start on the top of the uterus and spread down to the lower abdomen and back.  A sense of increased pelvic pressure or back  pain.  A watery or bloody mucus discharge that comes from the vagina. If you have any of these signs before the 37th week of pregnancy, call your caregiver right away. You need to go to the hospital to get checked immediately. HOME CARE INSTRUCTIONS   Avoid all smoking, herbs, alcohol, and unprescribed drugs. These chemicals affect the formation and growth of the baby.  Follow your caregiver's instructions regarding medicine use. There are medicines that are either safe or unsafe to take during pregnancy.  Exercise only as directed by your caregiver. Experiencing uterine cramps is a good sign to stop exercising.  Continue to eat regular, healthy meals.  Wear a good support bra for breast tenderness.  Do not use hot tubs, steam rooms, or saunas.  Wear your seat belt at all times when driving.  Avoid raw meat, uncooked cheese, cat litter boxes, and soil used by cats. These carry germs that can cause birth defects in the baby.  Take your prenatal vitamins.  Try taking a stool softener (if your caregiver approves) if you develop constipation. Eat more high-fiber foods, such as fresh vegetables or fruit and whole grains. Drink plenty of fluids to keep your urine clear or pale yellow.  Take warm sitz baths to soothe any pain or discomfort caused by hemorrhoids. Use hemorrhoid cream if your caregiver approves.  If you develop varicose veins, wear support hose. Elevate your feet for 15 minutes, 3-4 times a day. Limit salt in your diet.  Avoid heavy lifting, wear low heal shoes, and practice good posture.  Rest a lot with your legs elevated if you have leg cramps or low back pain.  Visit your dentist if you have not gone during your pregnancy. Use a soft toothbrush to brush your teeth and be gentle when you floss.  A sexual relationship may be continued unless your caregiver directs you otherwise.  Do not travel far distances unless it is absolutely necessary and only with the approval  of your caregiver.  Take prenatal classes to understand, practice, and ask questions about the labor and delivery.  Make a trial run to the hospital.  Pack your hospital bag.  Prepare the baby's nursery.  Continue to go to all your prenatal visits as directed by your caregiver. SEEK MEDICAL CARE IF:  You are unsure if you are in labor or if your water has broken.  You have dizziness.  You have mild pelvic cramps, pelvic pressure, or nagging pain in your abdominal area.  You have persistent nausea, vomiting, or diarrhea.  You have a bad smelling vaginal discharge.  You have pain with urination. SEEK IMMEDIATE MEDICAL CARE IF:   You have a fever.  You are leaking fluid from your vagina.  You have spotting or bleeding from your vagina.  You have severe abdominal cramping or pain.  You have rapid weight loss or gain.  You have shortness of breath with chest pain.  You notice sudden or extreme swelling   of your face, hands, ankles, feet, or legs.  You have not felt your baby move in over an hour.  You have severe headaches that do not go away with medicine.  You have vision changes. Document Released: 09/28/2001 Document Revised: 10/09/2013 Document Reviewed: 12/05/2012 [employer] Patient Information 2015 [employer]. This information is not intended to replace advice given to you by your health care provider. Make sure you discuss any questions you have with your health care provider.  

## 2014-09-24 ENCOUNTER — Encounter: Payer: Medicaid Other | Admitting: Obstetrics and Gynecology

## 2014-09-24 ENCOUNTER — Encounter: Payer: Self-pay | Admitting: Obstetrics and Gynecology

## 2014-09-24 NOTE — Patient Instructions (Signed)
Your procedure is scheduled on:  Friday, September 27, 2014  [facility] at:  1:00P.M.  Pick up the phone at the desk and dial [phone].  Call this number if you have problems the morning of surgery: [phone].  Remember: Do NOT eat food:  AFTER MIDNIGHT THURSDAY Do NOT drink clear liquids after:  AFTER 10:30 A.M. FRIDAY Take these medicines the morning of surgery with a SIP OF WATER: NONE  Do NOT wear jewelry (body piercing), metal hair clips/bobby pins, or nail polish. Do NOT wear lotions, powders, or perfumes.  You may wear deoderant. Do NOT shave for 48 hours prior to surgery. Do NOT bring valuables to the hospital. Leave suitcase in car.  After surgery it may be brought to your room.  For patients admitted to the hospital, checkout time is 11:00 AM the day of discharge.

## 2014-09-25 ENCOUNTER — Encounter (HOSPITAL_COMMUNITY)
Admission: RE | Admit: 2014-09-25 | Discharge: 2014-09-25 | Disposition: A | Discharge status: Home / Self Care | Payer: BC Managed Care – PPO | Source: Ambulatory Visit | Attending: Obstetrics and Gynecology | Admitting: Obstetrics and Gynecology

## 2014-09-25 ENCOUNTER — Encounter (HOSPITAL_COMMUNITY): Payer: Self-pay

## 2014-09-25 VITALS — BP 113/75 | HR 133 | Temp 97.9°F | Resp 20 | Ht 66.5 in | Wt 209.0 lb

## 2014-09-25 DIAGNOSIS — Z283 Underimmunization status: Secondary | ICD-10-CM

## 2014-09-25 DIAGNOSIS — Z01812 Encounter for preprocedural laboratory examination: Secondary | ICD-10-CM

## 2014-09-25 DIAGNOSIS — O9989 Other specified diseases and conditions complicating pregnancy, childbirth and the puerperium: Principal | ICD-10-CM

## 2014-09-25 DIAGNOSIS — Z2839 Other underimmunization status: Secondary | ICD-10-CM

## 2014-09-25 HISTORY — DX: Gastro-esophageal reflux disease without esophagitis: K21.9

## 2014-09-25 HISTORY — DX: Anemia, unspecified: D64.9

## 2014-09-25 LAB — CBC
HCT: 28.5 % — ABNORMAL LOW (ref 36.0–46.0)
Hemoglobin: 9 g/dL — ABNORMAL LOW (ref 12.0–15.0)
MCH: 24.5 pg — ABNORMAL LOW (ref 26.0–34.0)
MCHC: 31.6 g/dL (ref 30.0–36.0)
MCV: 77.4 fL — ABNORMAL LOW (ref 78.0–100.0)
Platelets: 224 10*3/uL (ref 150–400)
RBC: 3.68 MIL/uL — AB (ref 3.87–5.11)
RDW: 17.1 % — ABNORMAL HIGH (ref 11.5–15.5)
WBC: 9.9 10*3/uL (ref 4.0–10.5)

## 2014-09-25 LAB — TYPE AND SCREEN
ABO/RH(D): O POS
ANTIBODY SCREEN: NEGATIVE

## 2014-09-25 LAB — RPR

## 2014-09-25 LAB — ABO/RH: ABO/RH(D): O POS

## 2014-09-26 ENCOUNTER — Telehealth: Payer: Self-pay | Admitting: *Deleted

## 2014-09-26 NOTE — Telephone Encounter (Addendum)
Pt left message stating that someone was supposed to fax a letter to her job regarding her being out of work. They have not received the letter. Their fax number is [phone]. Please call back.  I returned pt's call and informed her that I did not see any notes from a provider that she was to be out of work.  She stated that Dr. Adrian BlackwaterStinson told her last week that she could begin her maternity leave on Monday 12/7.  Pt is scheduled for repeat C/S tomorrow.  I advised pt that I will send a message to Dr. Adrian BlackwaterStinson and await his response. If approved, I can compose the letter and she can pick up a copy. I cannot fax it to her employer unless she signs a release of information document.  Pt voiced understanding.  12/14  1620  Called pt after receiving response from Dr. Adrian BlackwaterStinson.  I left a message that he did intend for her to begin Maternity Leave on 12/7 and thought that a letter had been given to her on 12/1.  A letter has been prepared and is ready for her to pick up at our front desk.  She may call back if she has any additional questions.

## 2014-09-27 ENCOUNTER — Inpatient Hospital Stay (HOSPITAL_COMMUNITY)
Admission: RE | Admit: 2014-09-27 | Discharge: 2014-09-30 | DRG: 766 | Disposition: A | Discharge status: Home / Self Care | Payer: BC Managed Care – PPO | Source: Ambulatory Visit | Attending: Obstetrics and Gynecology | Admitting: Obstetrics and Gynecology

## 2014-09-27 ENCOUNTER — Encounter (HOSPITAL_COMMUNITY): Payer: Self-pay

## 2014-09-27 ENCOUNTER — Encounter (HOSPITAL_COMMUNITY)
Admission: RE | Disposition: A | Discharge status: Home / Self Care | Payer: Self-pay | Source: Ambulatory Visit | Attending: Obstetrics and Gynecology

## 2014-09-27 ENCOUNTER — Inpatient Hospital Stay (HOSPITAL_COMMUNITY): Payer: BC Managed Care – PPO | Admitting: Anesthesiology

## 2014-09-27 DIAGNOSIS — O09899 Supervision of other high risk pregnancies, unspecified trimester: Secondary | ICD-10-CM

## 2014-09-27 DIAGNOSIS — Z3A39 39 weeks gestation of pregnancy: Secondary | ICD-10-CM | POA: Diagnosis present

## 2014-09-27 DIAGNOSIS — O3421 Maternal care for scar from previous cesarean delivery: Secondary | ICD-10-CM | POA: Diagnosis present

## 2014-09-27 DIAGNOSIS — Z302 Encounter for sterilization: Secondary | ICD-10-CM | POA: Diagnosis not present

## 2014-09-27 DIAGNOSIS — O9989 Other specified diseases and conditions complicating pregnancy, childbirth and the puerperium: Secondary | ICD-10-CM

## 2014-09-27 DIAGNOSIS — Z3483 Encounter for supervision of other normal pregnancy, third trimester: Secondary | ICD-10-CM | POA: Diagnosis present

## 2014-09-27 DIAGNOSIS — Z283 Underimmunization status: Secondary | ICD-10-CM

## 2014-09-27 SURGERY — Surgical Case
Anesthesia: Spinal | Site: Abdomen

## 2014-09-27 MED ORDER — DIPHENHYDRAMINE HCL 25 MG PO CAPS
25.0000 mg | ORAL_CAPSULE | Freq: Four times a day (QID) | ORAL | Status: DC | PRN
  Administered 2014-09-28: 25 mg via ORAL

## 2014-09-27 MED ORDER — HYDROMORPHONE HCL 1 MG/ML IJ SOLN: 0.2500 mg | INTRAMUSCULAR | Status: DC | PRN

## 2014-09-27 MED ORDER — MEPERIDINE HCL 25 MG/ML IJ SOLN: 6.2500 mg | INTRAMUSCULAR | Status: DC | PRN

## 2014-09-27 MED ORDER — OXYCODONE-ACETAMINOPHEN 5-325 MG PO TABS
1.0000 | ORAL_TABLET | ORAL | Status: DC | PRN
  Administered 2014-09-28: 1 via ORAL
  Filled 2014-09-27: qty 1

## 2014-09-27 MED ORDER — DIPHENHYDRAMINE HCL 50 MG/ML IJ SOLN: 12.5000 mg | INTRAMUSCULAR | Status: DC | PRN

## 2014-09-27 MED ORDER — LACTATED RINGERS IV SOLN: INTRAVENOUS | Status: DC

## 2014-09-27 MED ORDER — ONDANSETRON HCL 4 MG/2ML IJ SOLN
4.0000 mg | Freq: Three times a day (TID) | INTRAMUSCULAR | Status: DC | PRN
  Administered 2014-09-27: 4 mg via INTRAVENOUS

## 2014-09-27 MED ORDER — LANOLIN HYDROUS EX OINT: 1.0000 "application " | TOPICAL_OINTMENT | CUTANEOUS | Status: DC | PRN

## 2014-09-27 MED ORDER — NALBUPHINE HCL 10 MG/ML IJ SOLN
5.0000 mg | INTRAMUSCULAR | Status: DC | PRN
  Administered 2014-09-28 (×2): 5 mg via INTRAVENOUS
  Filled 2014-09-27: qty 1

## 2014-09-27 MED ORDER — ONDANSETRON HCL 4 MG PO TABS: 4.0000 mg | ORAL_TABLET | ORAL | Status: DC | PRN

## 2014-09-27 MED ORDER — NALBUPHINE HCL 10 MG/ML IJ SOLN
5.0000 mg | Freq: Once | INTRAMUSCULAR | Status: AC | PRN
Start: 2014-09-27 — End: 2014-09-27
  Filled 2014-09-27: qty 1

## 2014-09-27 MED ORDER — METOCLOPRAMIDE HCL 5 MG/ML IJ SOLN
10.0000 mg | Freq: Once | INTRAMUSCULAR | Status: AC
  Administered 2014-09-27: 10 mg via INTRAVENOUS
  Filled 2014-09-27: qty 2

## 2014-09-27 MED ORDER — SIMETHICONE 80 MG PO CHEW
80.0000 mg | CHEWABLE_TABLET | ORAL | Status: DC
  Administered 2014-09-28 – 2014-09-29 (×3): 80 mg via ORAL
  Filled 2014-09-27 (×3): qty 1

## 2014-09-27 MED ORDER — OXYTOCIN 10 UNIT/ML IJ SOLN
INTRAMUSCULAR | Status: AC
  Filled 2014-09-27: qty 4

## 2014-09-27 MED ORDER — FENTANYL CITRATE 0.05 MG/ML IJ SOLN
INTRAMUSCULAR | Status: AC
Start: 2014-09-27 — End: 2014-09-27
  Filled 2014-09-27: qty 2

## 2014-09-27 MED ORDER — NALBUPHINE HCL 10 MG/ML IJ SOLN: 5.0000 mg | Freq: Once | INTRAMUSCULAR | Status: AC | PRN

## 2014-09-27 MED ORDER — TETANUS-DIPHTH-ACELL PERTUSSIS 5-2.5-18.5 LF-MCG/0.5 IM SUSP: 0.5000 mL | Freq: Once | INTRAMUSCULAR | Status: DC

## 2014-09-27 MED ORDER — SIMETHICONE 80 MG PO CHEW
80.0000 mg | CHEWABLE_TABLET | Freq: Three times a day (TID) | ORAL | Status: DC
  Administered 2014-09-28 – 2014-09-29 (×6): 80 mg via ORAL
  Filled 2014-09-27 (×6): qty 1

## 2014-09-27 MED ORDER — KETOROLAC TROMETHAMINE 30 MG/ML IJ SOLN: 30.0000 mg | Freq: Four times a day (QID) | INTRAMUSCULAR | Status: DC | PRN

## 2014-09-27 MED ORDER — 0.9 % SODIUM CHLORIDE (POUR BTL) OPTIME
TOPICAL | Status: DC | PRN
  Administered 2014-09-27: 1000 mL

## 2014-09-27 MED ORDER — SENNOSIDES-DOCUSATE SODIUM 8.6-50 MG PO TABS
2.0000 | ORAL_TABLET | ORAL | Status: DC
  Administered 2014-09-28 – 2014-09-29 (×3): 2 via ORAL
  Filled 2014-09-27 (×3): qty 2

## 2014-09-27 MED ORDER — HYDROMORPHONE HCL 1 MG/ML IJ SOLN
INTRAMUSCULAR | Status: AC
  Filled 2014-09-27: qty 1

## 2014-09-27 MED ORDER — KETOROLAC TROMETHAMINE 30 MG/ML IJ SOLN
INTRAMUSCULAR | Status: AC
  Filled 2014-09-27: qty 1

## 2014-09-27 MED ORDER — PHENYLEPHRINE 8 MG IN D5W 100 ML (0.08MG/ML) PREMIX OPTIME
INJECTION | INTRAVENOUS | Status: DC | PRN
  Administered 2014-09-27: 60 ug/min via INTRAVENOUS

## 2014-09-27 MED ORDER — SCOPOLAMINE 1 MG/3DAYS TD PT72: 1.0000 | MEDICATED_PATCH | Freq: Once | TRANSDERMAL | Status: DC

## 2014-09-27 MED ORDER — MORPHINE SULFATE (PF) 0.5 MG/ML IJ SOLN
INTRAMUSCULAR | Status: DC | PRN
Start: 2014-09-27 — End: 2014-09-27
  Administered 2014-09-27: 4900 ug via INTRAVENOUS
  Administered 2014-09-27: 100 ug via INTRATHECAL

## 2014-09-27 MED ORDER — SODIUM CHLORIDE 0.9 % IJ SOLN: 3.0000 mL | INTRAMUSCULAR | Status: DC | PRN

## 2014-09-27 MED ORDER — PHENYLEPHRINE 40 MCG/ML (10ML) SYRINGE FOR IV PUSH (FOR BLOOD PRESSURE SUPPORT)
PREFILLED_SYRINGE | INTRAVENOUS | Status: AC
  Filled 2014-09-27: qty 15

## 2014-09-27 MED ORDER — SCOPOLAMINE 1 MG/3DAYS TD PT72
MEDICATED_PATCH | TRANSDERMAL | Status: AC
  Administered 2014-09-27: 1.5 mg via TRANSDERMAL
  Filled 2014-09-27: qty 1

## 2014-09-27 MED ORDER — OXYTOCIN 40 UNITS IN LACTATED RINGERS INFUSION - SIMPLE MED: 62.5000 mL/h | INTRAVENOUS | Status: AC

## 2014-09-27 MED ORDER — LACTATED RINGERS IV SOLN
INTRAVENOUS | Status: DC | PRN
  Administered 2014-09-27: 15:00:00 via INTRAVENOUS

## 2014-09-27 MED ORDER — ONDANSETRON HCL 4 MG/2ML IJ SOLN
INTRAMUSCULAR | Status: AC
  Filled 2014-09-27: qty 2

## 2014-09-27 MED ORDER — FENTANYL CITRATE 0.05 MG/ML IJ SOLN
INTRAMUSCULAR | Status: DC | PRN
  Administered 2014-09-27 (×2): 50 ug via INTRAVENOUS
  Administered 2014-09-27: 100 ug via EPIDURAL

## 2014-09-27 MED ORDER — LACTATED RINGERS IV SOLN
Freq: Once | INTRAVENOUS | Status: AC
  Administered 2014-09-27: 1000 mL/h via INTRAVENOUS

## 2014-09-27 MED ORDER — IBUPROFEN 600 MG PO TABS
600.0000 mg | ORAL_TABLET | Freq: Four times a day (QID) | ORAL | Status: DC
  Administered 2014-09-28 – 2014-09-30 (×10): 600 mg via ORAL
  Filled 2014-09-27 (×10): qty 1

## 2014-09-27 MED ORDER — PHENYLEPHRINE HCL 10 MG/ML IJ SOLN
INTRAMUSCULAR | Status: DC | PRN
  Administered 2014-09-27: 80 ug via INTRAVENOUS
  Administered 2014-09-27 (×2): 120 ug via INTRAVENOUS
  Administered 2014-09-27: 80 ug via INTRAVENOUS
  Administered 2014-09-27: 120 ug via INTRAVENOUS
  Administered 2014-09-27: 80 ug via INTRAVENOUS

## 2014-09-27 MED ORDER — NALBUPHINE HCL 10 MG/ML IJ SOLN: 5.0000 mg | INTRAMUSCULAR | Status: DC | PRN

## 2014-09-27 MED ORDER — DIPHENHYDRAMINE HCL 25 MG PO CAPS
25.0000 mg | ORAL_CAPSULE | ORAL | Status: DC | PRN
  Filled 2014-09-27 (×2): qty 1

## 2014-09-27 MED ORDER — SCOPOLAMINE 1 MG/3DAYS TD PT72
1.0000 | MEDICATED_PATCH | Freq: Once | TRANSDERMAL | Status: DC
  Administered 2014-09-27: 1.5 mg via TRANSDERMAL

## 2014-09-27 MED ORDER — NALOXONE HCL 0.4 MG/ML IJ SOLN: 0.4000 mg | INTRAMUSCULAR | Status: DC | PRN

## 2014-09-27 MED ORDER — OXYCODONE-ACETAMINOPHEN 5-325 MG PO TABS
2.0000 | ORAL_TABLET | ORAL | Status: DC | PRN
  Administered 2014-09-28 – 2014-09-30 (×9): 2 via ORAL
  Filled 2014-09-27 (×9): qty 2

## 2014-09-27 MED ORDER — FENTANYL CITRATE 0.05 MG/ML IJ SOLN
INTRAMUSCULAR | Status: AC
  Filled 2014-09-27: qty 2

## 2014-09-27 MED ORDER — ONDANSETRON HCL 4 MG/2ML IJ SOLN: 4.0000 mg | INTRAMUSCULAR | Status: DC | PRN

## 2014-09-27 MED ORDER — HYDROMORPHONE HCL 1 MG/ML IJ SOLN
INTRAMUSCULAR | Status: DC | PRN
  Administered 2014-09-27: 1 mg via INTRAVENOUS

## 2014-09-27 MED ORDER — PRENATAL MULTIVITAMIN CH
1.0000 | ORAL_TABLET | Freq: Every day | ORAL | Status: DC
  Administered 2014-09-28 – 2014-09-29 (×2): 1 via ORAL
  Filled 2014-09-27 (×2): qty 1

## 2014-09-27 MED ORDER — SIMETHICONE 80 MG PO CHEW
80.0000 mg | CHEWABLE_TABLET | ORAL | Status: DC | PRN
  Filled 2014-09-27: qty 1

## 2014-09-27 MED ORDER — NALOXONE HCL 1 MG/ML IJ SOLN
1.0000 ug/kg/h | INTRAVENOUS | Status: DC | PRN
  Filled 2014-09-27: qty 2

## 2014-09-27 MED ORDER — CEFAZOLIN SODIUM-DEXTROSE 2-3 GM-% IV SOLR
INTRAVENOUS | Status: DC | PRN
  Administered 2014-09-27: 2 g via INTRAVENOUS

## 2014-09-27 MED ORDER — LACTATED RINGERS IV SOLN
40.0000 [IU] | INTRAVENOUS | Status: DC | PRN
  Administered 2014-09-27: 40 [IU] via INTRAVENOUS

## 2014-09-27 MED ORDER — MENTHOL 3 MG MT LOZG: 1.0000 | LOZENGE | OROMUCOSAL | Status: DC | PRN

## 2014-09-27 MED ORDER — LIDOCAINE-EPINEPHRINE (PF) 2 %-1:200000 IJ SOLN
INTRAMUSCULAR | Status: DC | PRN
  Administered 2014-09-27: 3 mL via EPIDURAL
  Administered 2014-09-27 (×2): 5 mL via EPIDURAL

## 2014-09-27 MED ORDER — KETOROLAC TROMETHAMINE 30 MG/ML IJ SOLN
30.0000 mg | Freq: Four times a day (QID) | INTRAMUSCULAR | Status: DC | PRN
  Administered 2014-09-27: 30 mg via INTRAMUSCULAR

## 2014-09-27 MED ORDER — MORPHINE SULFATE 0.5 MG/ML IJ SOLN
INTRAMUSCULAR | Status: AC
  Filled 2014-09-27: qty 10

## 2014-09-27 MED ORDER — WITCH HAZEL-GLYCERIN EX PADS: 1.0000 "application " | MEDICATED_PAD | CUTANEOUS | Status: DC | PRN

## 2014-09-27 MED ORDER — LACTATED RINGERS IV SOLN
INTRAVENOUS | Status: DC
  Administered 2014-09-27 (×3): via INTRAVENOUS

## 2014-09-27 MED ORDER — ONDANSETRON HCL 4 MG/2ML IJ SOLN
INTRAMUSCULAR | Status: DC | PRN
  Administered 2014-09-27: 4 mg via INTRAVENOUS

## 2014-09-27 MED ORDER — DIBUCAINE 1 % RE OINT: 1.0000 "application " | TOPICAL_OINTMENT | RECTAL | Status: DC | PRN

## 2014-09-27 MED ORDER — PHENYLEPHRINE 8 MG IN D5W 100 ML (0.08MG/ML) PREMIX OPTIME
INJECTION | INTRAVENOUS | Status: AC
  Filled 2014-09-27: qty 100

## 2014-09-27 MED ORDER — CEFAZOLIN SODIUM-DEXTROSE 2-3 GM-% IV SOLR
INTRAVENOUS | Status: AC
  Filled 2014-09-27: qty 50

## 2014-09-27 SURGICAL SUPPLY — 32 items
BENZOIN TINCTURE PRP APPL 2/3 (GAUZE/BANDAGES/DRESSINGS) ×4 IMPLANT
CLAMP CORD UMBIL (MISCELLANEOUS) ×4 IMPLANT
CLOSURE WOUND 1/2 X4 (GAUZE/BANDAGES/DRESSINGS) ×1
CONTAINER PREFILL 10% NBF 15ML (MISCELLANEOUS) ×8 IMPLANT
DRAPE SHEET LG 3/4 BI-LAMINATE (DRAPES) ×4 IMPLANT
DRSG OPSITE POSTOP 4X10 (GAUZE/BANDAGES/DRESSINGS) ×4 IMPLANT
DURAPREP 26ML APPLICATOR (WOUND CARE) ×4 IMPLANT
ELECT REM PT RETURN 9FT ADLT (ELECTROSURGICAL) ×4
ELECTRODE REM PT RTRN 9FT ADLT (ELECTROSURGICAL) ×2 IMPLANT
EXTRACTOR VACUUM M CUP 4 TUBE (SUCTIONS) ×3 IMPLANT
EXTRACTOR VACUUM M CUP 4' TUBE (SUCTIONS) ×1
GAUZE SPONGE 4X4 12PLY STRL (GAUZE/BANDAGES/DRESSINGS) ×4 IMPLANT
GLOVE BIOGEL PI IND STRL 6.5 (GLOVE) ×2 IMPLANT
GLOVE BIOGEL PI INDICATOR 6.5 (GLOVE) ×2
GLOVE SURG SS PI 6.0 STRL IVOR (GLOVE) ×4 IMPLANT
GOWN STRL REUS W/TWL LRG LVL3 (GOWN DISPOSABLE) ×8 IMPLANT
KIT ABG SYR 3ML LUER SLIP (SYRINGE) IMPLANT
NEEDLE HYPO 25X5/8 SAFETYGLIDE (NEEDLE) IMPLANT
NS IRRIG 1000ML POUR BTL (IV SOLUTION) ×4 IMPLANT
PACK C SECTION WH (CUSTOM PROCEDURE TRAY) ×4 IMPLANT
PAD ABD 7.5X8 STRL (GAUZE/BANDAGES/DRESSINGS) ×4 IMPLANT
PAD OB MATERNITY 4.3X12.25 (PERSONAL CARE ITEMS) ×4 IMPLANT
RTRCTR C-SECT PINK 25CM LRG (MISCELLANEOUS) ×4 IMPLANT
SEPRAFILM MEMBRANE 5X6 (MISCELLANEOUS) IMPLANT
STAPLER VISISTAT 35W (STAPLE) IMPLANT
STRIP CLOSURE SKIN 1/2X4 (GAUZE/BANDAGES/DRESSINGS) ×3 IMPLANT
SUT PLAIN 0 NONE (SUTURE) ×4 IMPLANT
SUT VIC AB 0 CT1 36 (SUTURE) ×16 IMPLANT
SUT VIC AB 4-0 KS 27 (SUTURE) ×4 IMPLANT
TOWEL OR 17X24 6PK STRL BLUE (TOWEL DISPOSABLE) ×4 IMPLANT
TRAY FOLEY CATH 14FR (SET/KITS/TRAYS/PACK) ×4 IMPLANT
WATER STERILE IRR 1000ML POUR (IV SOLUTION) IMPLANT

## 2014-09-27 NOTE — Transfer of Care (Signed)
Immediate Anesthesia Transfer of Care Note  Patient: Christine Cooke  Procedure(s) Performed: Procedure(s): CESAREAN SECTION (N/A)  Patient Location: PACU  Anesthesia Type:Spinal and Epidural  Level of Consciousness: awake, alert  and oriented  Airway & Oxygen Therapy: Patient Spontanous Breathing  Post-op Assessment: Report given to PACU RN and Post -op Vital signs reviewed and stable  Post vital signs: Reviewed and stable  Complications: No apparent anesthesia complications

## 2014-09-27 NOTE — Op Note (Signed)
Christine Cooke PROCEDURE DATE: 09/27/2014  PREOPERATIVE DIAGNOSIS: Intrauterine pregnancy at  8481w0d weeks gestation; previous uterine incision kerr x3 or greater and undesired fertility  POSTOPERATIVE DIAGNOSIS: The same  PROCEDURE:     Cesarean Section and bilateral tubal ligation using modified Pomeroi procedure  SURGEON:  Dr. Gigi GinPeggy Arun Herrod  ASSISTANT: none  INDICATIONS: Christine Cooke is a 28 y.o. [id number] at 9181w0d scheduled for cesarean section secondary to previous uterine incision kerr x3 or greater.  The risks of cesarean section discussed with the patient included but were not limited to: bleeding which may require transfusion or reoperation; infection which may require antibiotics; injury to bowel, bladder, ureters or other surrounding organs; injury to the fetus; need for additional procedures including hysterectomy in the event of a life-threatening hemorrhage; placental abnormalities wth subsequent pregnancies, incisional problems, thromboembolic phenomenon and other postoperative/anesthesia complications. The patient concurred with the proposed plan, giving informed written consent for the procedure.  The patient also desires permanent sterilization. Risks and benefits of procedure discussed with patient including permanence of method, bleeding, infection, injury to surrounding organs and need for additional procedures. Risk failure of 0.5-1% with increased risk of ectopic gestation if pregnancy occurs was also discussed with patient.   FINDINGS:  Viable female infant in cephalic presentation.  Apgars 9 and 9.  Clear amniotic fluid.  Intact placenta, three vessel cord.  Normal uterus, fallopian tubes and ovaries bilaterally.  ANESTHESIA:    Spinal INTRAVENOUS FLUIDS:2800 ml ESTIMATED BLOOD LOSS: 1000 ml URINE OUTPUT:  400 ml SPECIMENS: Placenta sent to L&D COMPLICATIONS: None immediate  PROCEDURE IN DETAIL:  The patient received intravenous antibiotics and had sequential  compression devices applied to her lower extremities while in the preoperative area.  She was then taken to the operating room where anesthesia was induced and was found to be adequate. A foley catheter was placed into her bladder and attached to [name] gravity. She was then placed in a dorsal supine position with a leftward tilt, and prepped and draped in a sterile manner. After an adequate timeout was performed, a Pfannenstiel skin incision was made with scalpel and carried through to the underlying layer of fascia. The fascia was incised in the midline and this incision was extended bilaterally using the Mayo scissors. Kocher clamps were applied to the superior aspect of the fascial incision and the underlying rectus muscles were dissected off bluntly. A similar process was carried out on the inferior aspect of the facial incision. The rectus muscles were separated in the midline bluntly and the peritoneum was entered bluntly. The Alexis self-retaining retractor was introduced into the abdominal cavity. Attention was turned to the lower uterine segment where a bladder flap was created, and a transverse hysterotomy was made with a scalpel and extended bilaterally bluntly. The infant was successfully delivered, and cord was clamped and cut and infant was handed over to awaiting neonatology team. Uterine massage was then administered and the placenta delivered intact with three-vessel cord. The uterus was cleared of clot and debris.  The hysterotomy was closed with 0 Vicryl in a running locked fashion, and an imbricating layer was also placed with a 0 Vicryl. Overall, excellent hemostasis was noted. The pelvis copiously irrigated and cleared of all clot and debris. Hemostasis was confirmed on all surfaces. The patient's left fallopian tube was then identified, brought to the incision, and grasped with a Babcock clamp. The tube was then followed out to the fimbria. The Babcock clamp was then used to grasp the tube  approximately 4 cm from the cornual region. A 3 cm segment of the tube was then ligated with free tie of plain gut suture, transected and excised. Good hemostasis was noted and the tube was returned to the abdomen. The right fallopian tube was then identified to its fimbriated end, ligated, and a 3 cm segment excised in a similar fashion. Excellent hemostasis was noted, and the tube returned to the abdomen. The peritoneum and the muscles were reapproximated using 0 vicryl interrupted stitches. The fascia was then closed using 0 Vicryl in a running locked fashion.  The subcutaneous layer was reapproximated with plain gut and the skin was closed in a subcuticular fashion using 3.0 Vicryl. The patient tolerated the procedure well. Sponge, lap, instrument and needle counts were correct x 2. She was taken to the recovery room in stable condition.    Christine Cooke,PEGGYMD  09/27/2014 4:07 PM

## 2014-09-27 NOTE — Anesthesia Procedure Notes (Signed)
Epidural Patient location during procedure: OR  Preanesthetic Checklist Completed: patient identified, site marked, surgical consent, pre-op evaluation, timeout performed, IV checked, risks and benefits discussed and monitors and equipment checked  Epidural Patient position: sitting Prep: site prepped and draped and DuraPrep Patient monitoring: continuous pulse ox and blood pressure Approach: midline Location: L3-L4 Injection technique: LOR air  Needle:  Needle type: Tuohy  Needle gauge: 17 G Needle length: 9 cm and 9 Needle insertion depth: 7 cm Catheter type: closed end flexible Catheter size: 19 Gauge Catheter at skin depth: 14 cm Test dose: negative  Assessment Events: blood not aspirated, injection not painful, no injection resistance, negative IV test and no paresthesia  Additional Notes Spinal Dosage in OR ; Sprotte thru touhy  Bupivicaine ml       1.6 PFMS04   mcg        100 Catheter passed easily (-) asp CSF/Heme

## 2014-09-27 NOTE — Anesthesia Postprocedure Evaluation (Signed)
  Anesthesia Post-op Note  Patient: Christine Cooke  Procedure(s) Performed: Procedure(s): CESAREAN SECTION WITH BILATERAL TUBAL LIGATION  Patient is awake, responsive, moving her legs, and has signs of resolution of her numbness. Pain and nausea are reasonably well controlled. Vital signs are stable and clinically acceptable. Oxygen saturation is clinically acceptable. There are no apparent anesthetic complications at this time. Patient is ready for discharge.

## 2014-09-27 NOTE — Anesthesia Preprocedure Evaluation (Signed)
Anesthesia Evaluation  Patient identified by MRN, date of birth, ID band Patient awake    Reviewed: Allergy & Precautions, H&P , Patient's Chart, lab work & pertinent test results  Airway Mallampati: II  TM Distance: >3 FB Neck ROM: full    Dental no notable dental hx.    Pulmonary former smoker,  breath sounds clear to auscultation  Pulmonary exam normal       Cardiovascular Exercise Tolerance: Good Rhythm:regular Rate:Normal     Neuro/Psych    GI/Hepatic GERD-  ,  Endo/Other    Renal/GU      Musculoskeletal   Abdominal   Peds  Hematology  (+) anemia ,   Anesthesia Other Findings   Reproductive/Obstetrics                             Anesthesia Physical Anesthesia Plan  ASA: II  Anesthesia Plan: Spinal   Post-op Pain Management:    Induction:   Airway Management Planned:   Additional Equipment:   Intra-op Plan:   Post-operative Plan:   Informed Consent: I have reviewed the patients History and Physical, chart, labs and discussed the procedure including the risks, benefits and alternatives for the proposed anesthesia with the patient or authorized representative who has indicated his/her understanding and acceptance.   Dental Advisory Given  Plan Discussed with: CRNA  Anesthesia Plan Comments: (Lab work confirmed with CRNA in room. Platelets okay. Discussed spinal anesthetic, and patient consents to the procedure:  included risk of possible headache,backache, failed block, allergic reaction, and nerve injury. This patient was asked if she had any questions or concerns before the procedure started. )        Anesthesia Quick Evaluation

## 2014-09-27 NOTE — H&P (Signed)
Laverle Zadie CleverlyS Ruhlman is a 28 y.o. female [phone] at 3839 weeks presenting for scheduled repeat cesarean section with bilateral tubal ligation. [facility] other than 3 previous cesarean section. History OB History    Gravida Para Term Preterm AB TAB SAB Ectopic Multiple Living   5 3 3  1 1    3      Past Medical History  Diagnosis Date  . Ovarian cyst   . GERD (gastroesophageal reflux disease)     with pregnancy  . Anemia    Past Surgical History  Procedure Laterality Date  . Cesarean section    . Induced abortion     Family History: family history includes Asthma in her maternal grandmother; Diabetes in her maternal aunt; Heart disease in her maternal grandmother; Hyperlipidemia in her maternal grandmother; Hypertension in her maternal aunt, maternal grandmother, and mother; Sickle cell anemia in her sister; Stroke in her maternal grandmother. Social History:  reports that she quit smoking about 5 years ago. Her smoking use included Cigarettes. She has a .5 pack-year smoking history. She has never used smokeless tobacco. She reports that she drinks alcohol. She reports that she uses illicit drugs (Marijuana).   Prenatal Transfer Tool  Maternal Diabetes: No Genetic Screening: Declined Maternal Ultrasounds/Referrals: Normal Fetal Ultrasounds or other Referrals:  None Maternal Substance Abuse:  No Significant Maternal Medications:  None Significant Maternal Lab Results:  None Other Comments:  None  ROS    Blood pressure 106/70, pulse 118, temperature 98.4 F (36.9 C), temperature source Oral, resp. rate 20, last menstrual period 01/09/2014, SpO2 99 %. Exam Physical Exam  GENERAL: Well-developed, well-nourished female in no acute distress.  HEENT: Normocephalic, atraumatic. Sclerae anicteric.  NECK: Supple. Normal thyroid.  LUNGS: Clear to auscultation bilaterally.  HEART: Regular rate and rhythm. ABDOMEN: Soft, nontender,  gravid PELVIC: Not indicated EXTREMITIES: No cyanosis, clubbing, or edema, 2+ distal pulses.  Prenatal labs: ABO, Rh: --/--/O POS (12/09 1355) Antibody: NEG (12/09 1351) Rubella: 0.78 (07/29 1305) RPR: NON REAC (12/09 1350)  HBsAg: NEGATIVE (07/29 1305)  HIV: NONREACTIVE (09/30 1457)  GBS:   negative  Assessment/Plan: 28 yo G5P3013 at 39 weeks here for scheduled repeat cesarean section with bilateral tubal ligation - Risks and benefits were explained including but not limited to risks of bleeding, infection and damage to adjacent organs. - Patient also desires permanent sterilization. Risks and benefits of procedure discussed with patient including permanence of method, bleeding, infection, injury to surrounding organs and need for additional procedures. Risk failure of 0.5-1% with increased risk of ectopic gestation if pregnancy occurs was also discussed with patient.    Breta Demedeiros 09/27/2014, 2:10 PM

## 2014-09-28 LAB — CBC
HCT: 25.2 % — ABNORMAL LOW (ref 36.0–46.0)
Hemoglobin: 7.9 g/dL — ABNORMAL LOW (ref 12.0–15.0)
MCH: 24.2 pg — ABNORMAL LOW (ref 26.0–34.0)
MCHC: 31.3 g/dL (ref 30.0–36.0)
MCV: 77.3 fL — ABNORMAL LOW (ref 78.0–100.0)
PLATELETS: 224 10*3/uL (ref 150–400)
RBC: 3.26 MIL/uL — ABNORMAL LOW (ref 3.87–5.11)
RDW: 17.2 % — AB (ref 11.5–15.5)
WBC: 10.4 10*3/uL (ref 4.0–10.5)

## 2014-09-28 MED ORDER — MEASLES, MUMPS & RUBELLA VAC ~~LOC~~ INJ
0.5000 mL | INJECTION | Freq: Once | SUBCUTANEOUS | Status: DC
Start: 2014-09-29 — End: 2014-09-30
  Filled 2014-09-28: qty 0.5

## 2014-09-28 NOTE — Lactation Note (Signed)
This note was copied from the chart of Christine Foye Stannard. Lactation Consultation Note  Patient Name: Christine Cooke JXBJY'NToday's Date: 09/28/2014 Reason for consult: Follow-up assessment;Difficult latch Called by RN to assist Mom with latch. Mom has not been able to get baby to latch. He will not suckle at the breast. Mom has large, pendulous breasts, erect large nipples. Baby has small mouth and with suck exam will not suckle strong on LC finger. Attempted several positions using breast compression but could not get baby to sustain a latch. Initiated a #24 nipple shield. Took few attempts to get baby latched, used jaw massage to help with latch but still difficult to obtain good depth. Baby would not suckle unless nipple shield was pre-loaded with EBM, then would take few suckles and stop. Mom becoming frustrated and tired. LC demonstrated finger feeding using curved tipped syringe. Mom has pumped 15 ml of colostrum and finger fed this to baby, baby suckled well with finger feeding.  Advised Mom to keep attempting to get baby to latch, if he will not latch use nipple shield to help. Advised if baby does not latch well she needs to be pumping to encourage milk production and have EBM to give to baby till he learns to latch. RN aware of plan and will set Mom up with DEBP if next feeding does not improve. Advised Mom to call RN for assist tonight.   Maternal Data    Feeding Feeding Type: Breast Milk Length of feed: 5 min (off and on for about 5)  LATCH Score/Interventions Latch: Repeated attempts needed to sustain latch, nipple held in mouth throughout feeding, stimulation needed to elicit sucking reflex. Intervention(s): Skin to skin;Teach feeding cues;Waking techniques Intervention(s): Adjust position;Assist with latch;Breast massage;Breast compression  Audible Swallowing: None Intervention(s): Skin to skin;Hand expression  Type of Nipple: Everted at rest and after  stimulation Intervention(s): Hand pump  Comfort (Breast/Nipple): Soft / non-tender     Hold (Positioning): Assistance needed to correctly position infant at breast and maintain latch. Intervention(s): Breastfeeding basics reviewed;Support Pillows;Position options;Skin to skin (REQUESTED LC ASSISTANCE)  LATCH Score: 6  Lactation Tools Discussed/Used Tools: Nipple Dorris CarnesShields;Pump Nipple shield size: 24 Breast pump type: Manual   Consult Status Consult Status: Follow-up Date: 09/29/14 Follow-up type: In-patient    Alfred LevinsGranger, Chiquetta Langner Ann 09/28/2014, 9:37 PM

## 2014-09-28 NOTE — Lactation Note (Signed)
This note was copied from the chart of Christine Christine Cooke. Lactation Consultation Note  Patient Name: Christine Cooke BJYNW'GToday's Date: 09/28/2014 Reason for consult: Follow-up assessment  Baby is 25 hours old and LC changed 2 wets and 2 stools ( greenish mec stools and thinning )  Baby awake and LC assisted with attempt to latch and baby didn't seem overly interested , few sucks and swallows. LC worked with mom on positioning and depth at the breast . Mom mentioned with all her other babies she just pumped and bottle fed. This baby does show interest and mom has excellent flow os colostrum. Mother informed of post-discharge support and given phone number to the lactation department, including services for phone call assistance; out-patient appointments; and breastfeeding support group. List of other breastfeeding resources in the community given in the handout. Encouraged mother to call for problems or concerns related to breastfeeding.   Maternal Data Has patient been taught Hand Expression?: Yes Does the patient have breastfeeding experience prior to this delivery?: Yes  Feeding Feeding Type: Breast Fed Length of feed: 10 min  LATCH Score/Interventions Latch: Repeated attempts needed to sustain latch, nipple held in mouth throughout feeding, stimulation needed to elicit sucking reflex. Intervention(s): Skin to skin;Waking techniques Intervention(s): Adjust position;Assist with latch;Breast massage;Breast compression  Audible Swallowing: A few with stimulation Intervention(s): Hand expression  Type of Nipple: Everted at rest and after stimulation Intervention(s): No intervention needed  Comfort (Breast/Nipple): Soft / non-tender     Hold (Positioning): Assistance needed to correctly position infant at breast and maintain latch. Intervention(s): Breastfeeding basics reviewed  LATCH Score: 7  Lactation Tools Discussed/Used WIC Program: Yes (per mom, )   Consult  Status Consult Status: Follow-up Date: 09/28/14 Follow-up type: In-patient    Christine Cooke, Christine Cooke 09/28/2014, 5:02 PM

## 2014-09-28 NOTE — Progress Notes (Signed)
Clinical Social Work Department PSYCHOSOCIAL ASSESSMENT - MATERNAL/CHILD 09/28/2014  Patient:  Christine Cooke, Christine Cooke  Account Number:  [id number]  [place] Date:  09/27/2014  Ardine Eng Name:   Felipe Drone    Clinical Social Worker:  Deloras Reichard, LCSW   Date/Time:  09/28/2014 10:45 AM  Date Referred:  09/27/2014      Referred reason  Substance Abuse   Other referral source:    I:  FAMILY / HOME ENVIRONMENT Child's legal guardian:  PARENT  Guardian - Name Guardian - Age Guardian - Address  Schmader,Marjarie S [address] [place].  [place], [place] [address]  Joseph Berkshire  same as above   Other household support members/support persons Other support:   Extensive family support report    II  PSYCHOSOCIAL DATA Information Source:    Occupational hygienist Employment:   Both parent employed   Museum/gallery curator resources:  Multimedia programmer If [place]:   Other  Mother plans to apply for [employer] / Grade:   Maternity Care Coordinator / Child Services Coordination / Early Interventions:  Cultural issues impacting care:    III  STRENGTHS Strengths  Supportive family/friends  Home prepared for Child (including basic supplies)  Adequate Resources   Strength comment:    IV  RISK FACTORS AND CURRENT PROBLEMS Current Problem:     Risk Factor & Current Problem Patient Issue Family Issue Risk Factor / Current Problem Comment  Substance Abuse Y N Hx of marijuana use    V  SOCIAL WORK ASSESSMENT Acknowledged order for social work consult to address concerns regarding mother's hx of substance abuse.  Met with mother who was pleasant and receptive to CSW intervention.  Her father in law was present and she requested that he remain present during the assessment. Parents have been married for 2 years and have 3 other dependents ages 26, 61, and 2.  Both parents are employed. Mother admits hx of marijuana prior to becoming aware of the pregnancy.    She denies any need for  treatment.  She denies any other illicit drug use.   UDS on newborn was negative.  She denies any hx of mental illness.  No acute social concerns reported at this time.    Mother informed of social work Fish farm manager.      VI SOCIAL WORK PLAN Social Work Plan  No Barriers to Discharge   Type of pt/family education:   If child protective services report - county:   If child protective services report - date:   Information/referral to community resources comment:   Other social work plan:   Will continue to monitor drug screen.

## 2014-09-28 NOTE — Progress Notes (Signed)
Subjective: Postpartum Day #1: Cesarean Delivery/BTL Patient reports tolerating PO, + flatus and no problems voiding; breastfeeding; desires Depo to back-up her BTL  Objective: Vital signs in last 24 hours: Temp:  [97.6 F (36.4 C)-98.5 F (36.9 C)] 98.5 F (36.9 C) (12/12 0900) Pulse Rate:  [74-121] 82 (12/12 0900) Resp:  [13-23] 18 (12/12 0900) BP: (106-126)/(55-87) 111/65 mmHg (12/12 0900) SpO2:  [97 %-100 %] 98 % (12/12 0900) Weight:  [88.451 kg (195 lb)] 88.451 kg (195 lb) (12/12 0045)  Physical Exam:  General: alert, cooperative and mild distress  Heart: RRR Lungs: nl effort Lochia: appropriate Uterine Fundus: firm Incision: pressure dsg intact and dry DVT Evaluation: No evidence of DVT seen on physical exam.   Recent Labs  09/25/14 1350 09/28/14 0555  HGB 9.0* 7.9*  HCT 28.5* 25.2*    Assessment/Plan: Status post Cesarean section. Doing well postoperatively.  Continue current care. Anticipate d/c in AM 09/29/14.  Cam HaiSHAW, KIMBERLY CNM 09/28/2014, 9:28 AM

## 2014-09-28 NOTE — Anesthesia Postprocedure Evaluation (Signed)
  Anesthesia Post-op Note  Patient: Christine Cooke  Procedure(s) Performed: Procedure(s): CESAREAN SECTION WITH BILATERAL TUBAL LIGATION  Patient Location: PACU and Mother/Baby  Anesthesia Type:Spinal and Epidural  Level of Consciousness: awake, alert  and oriented  Airway and Oxygen Therapy: Patient Spontanous Breathing  Post-op Pain: mild  Post-op Assessment: Patient's Cardiovascular Status Stable, Respiratory Function Stable, No signs of Nausea or vomiting, Adequate PO intake, Pain level controlled and No headache  Post-op Vital Signs: Reviewed and stable  Last Vitals:  Filed Vitals:   09/28/14 1100  BP: 115/53  Pulse: 84  Temp: 37 C  Resp: 18    Complications: No apparent anesthesia complications

## 2014-09-28 NOTE — Addendum Note (Signed)
Addendum  created 09/28/14 1338 by Elbert Ewingsolleen S Milicent Acheampong, CRNA   Modules edited: Notes Section   Notes Section:  File: [id number]

## 2014-09-29 NOTE — Lactation Note (Signed)
This note was copied from the chart of Christine Cooke. Lactation Consultation Note  Patient Name: Christine Gwendalyn EgeChristyn Eide ZOXWR'UToday's Date: 09/29/2014 Reason for consult: Follow-up assessment;Difficult latch  Per mom I've been pumping and bottle feeding EBM today . The baby still won't open his mouth well. But I'm willing to try latching. LC assisted with latch , and due to baby not being able to maintain any depth at the breast tried a #24 Nipple shield  And baby latched for 8 mins with swallows noted and milk in the nipple shield. Baby released and LC was unable to relatch , even with formula in the top of the NS. Mom attempted several times to latch without the Nipple shield , baby would latch with shallow latch and no depth. LC recommended to just pump and bottle feed until the baby opens wider.  [facility] and moms milk is coming in and mom has been getting EBM yield of 30 ml at a time.    Maternal Data    Feeding Feeding Type: Bottle Fed - Formula Length of feed: 8 min (milk noted in the nipple shield , released the baby after 8 mins )  LATCH Score/Interventions Latch: Grasps breast easily, tongue down, lips flanged, rhythmical sucking. Intervention(s): Skin to skin;Teach feeding cues;Waking techniques Intervention(s): Adjust position;Assist with latch;Breast massage;Breast compression  Audible Swallowing: Spontaneous and intermittent  Type of Nipple: Everted at rest and after stimulation  Comfort (Breast/Nipple): Filling, red/small blisters or bruises, mild/mod discomfort  Problem noted: Filling  Hold (Positioning): Assistance needed to correctly position infant at breast and maintain latch. Intervention(s): Breastfeeding basics reviewed;Support Pillows;Position options;Skin to skin  LATCH Score: 8  Lactation Tools Discussed/Used Tools: Nipple Shields Nipple shield size: 24   Consult Status Consult Status: Follow-up Date:  09/30/14 Follow-up type: In-patient    Kathrin Greathouseorio, Kynnedi Zweig Ann 09/29/2014, 5:53 PM

## 2014-09-29 NOTE — Progress Notes (Signed)
Post Partum Day 1, s/p rLTCS and BTL Subjective: up ad lib, voiding, tolerating PO and reporting a lot of gas pains, not passing flatus yet  Objective: Blood pressure 117/74, pulse 91, temperature 97.9 F (36.6 C), temperature source Oral, resp. rate 16, height 5\' 7"  (1.702 m), weight 88.451 kg (195 lb), last menstrual period 01/09/2014, SpO2 98 %, unknown if currently breastfeeding.  Physical Exam:  General: alert, cooperative and no distress Lochia: appropriate Uterine Fundus: firm Incision: healing well, no significant drainage DVT Evaluation: No evidence of DVT seen on physical exam.   Recent Labs  09/28/14 0555  HGB 7.9*  HCT 25.2*    Assessment/Plan: Plan for discharge tomorrow and Breastfeeding   LOS: 2 days   Shirlee LatchBacigalupo, Ashaunte Standley 09/29/2014, 7:48 AM

## 2014-09-30 ENCOUNTER — Encounter: Payer: Self-pay | Admitting: *Deleted

## 2014-09-30 ENCOUNTER — Encounter (HOSPITAL_COMMUNITY): Payer: Self-pay | Admitting: Obstetrics and Gynecology

## 2014-09-30 MED ORDER — IBUPROFEN 600 MG PO TABS: 600.0000 mg | ORAL_TABLET | Freq: Four times a day (QID) | ORAL | Status: DC

## 2014-09-30 MED ORDER — OXYCODONE-ACETAMINOPHEN 5-325 MG PO TABS: 2.0000 | ORAL_TABLET | ORAL | Status: DC | PRN

## 2014-09-30 NOTE — Lactation Note (Signed)
This note was copied from the chart of Christine Cooke. Lactation Consultation Note         Follow up consult with this mom of a term baby, now 3564 hours old. Mom has not been able to get baby to latch, and on exam, he has an upper lip frenulum that is thick, and extends to his gum line, and a tongue frenulum that is short and mid posterior - these are probably contributing to his inability to latch or maintain latch. Mom has stict flow on her sides when pumping, by not taking bra down all the way. Mom is using ice, and was taught how to reverse massage with oil, laying flat in be, when she gets home, to decrease swelling in her breasts. I faxed  [facility] for mom to get an a[ppointment to apply, and [facility], and instructed her in it's use. Mom also has bleeding, painful blisters on her right nipple. i advised her to lower her suction, apply EBM, and gave her comfort gels with instruction also.   Patient Name: Christine Gwendalyn EgeChristyn Monjaraz XBJYN'WToday's Date: 09/30/2014 Reason for consult: Follow-up assessment   Maternal Data    Feeding Feeding Type: Bottle Fed - Breast Milk Nipple Type: Slow - flow Length of feed: 10 min  LATCH Score/Interventions       Type of Nipple: Everted at rest and after stimulation  Comfort (Breast/Nipple): Engorged, cracked, bleeding, large blisters, severe discomfort Problem noted: Engorgment;Cracked, bleeding, blisters, bruises Intervention(s): Ice;Hand expression;Reverse pressure Intervention(s): Double electric pump  Problem noted: Severe discomfort Interventions (Filling): Massage        Lactation Tools Discussed/Used WIC Program: Yes ([facility] to get [facility])   Consult Status Consult Status: Complete Follow-up type: Call as needed    Alfred LevinsLee, Dinnis Rog Anne 09/30/2014, 9:15 AM

## 2014-09-30 NOTE — Discharge Summary (Signed)
Obstetric Discharge Summary Reason for Admission: cesarean section Prenatal Procedures: ultrasound Intrapartum Procedures: cesarean: low cervical, transverse Postpartum Procedures: none Complications-Operative and Postpartum: none HEMOGLOBIN  Date Value Ref Range Status  09/28/2014 7.9* 12.0 - 15.0 g/dL Final   HCT  Date Value Ref Range Status  09/28/2014 25.2* 36.0 - 46.0 % Final    Physical Exam:  General: alert, cooperative, appears stated age and no distress Lochia: appropriate Uterine Fundus: firm Incision: healing well, no significant drainage, no dehiscence, no significant erythema DVT Evaluation: No evidence of DVT seen on physical exam. Negative Homan's sign. No cords or calf tenderness.  Discharge Diagnoses: Term Pregnancy-delivered  Discharge Information: Date: 09/30/2014 Activity: pelvic rest Diet: routine Medications: PNV, Ibuprofen and Percocet Condition: stable and improved Instructions: refer to practice specific booklet Discharge to: home   Newborn Data: Live born female  Birth Weight: 7 lb 10.1 oz (3460 g) APGAR: 9, 9  Home with mother.  Christine Cooke, Christine Cooke 09/30/2014, 5:47 AM

## 2014-09-30 NOTE — Progress Notes (Signed)
Ur chart review completed.  

## 2014-09-30 NOTE — Plan of Care (Signed)
Problem: Discharge Progression Outcomes Goal: MMR given as ordered Outcome: Not Met (add Reason) Patient declined

## 2014-10-01 ENCOUNTER — Telehealth: Payer: Self-pay | Admitting: *Deleted

## 2014-10-01 NOTE — Telephone Encounter (Signed)
Patient left message that she had a csection on Friday, and her incision is painful and bleeding, and she feels like it is opening up.   I called patient back and she stated that she has a dressing on and can't see her incision but there is some blood on the dressing. I advised patient to take the bandage off to see the actual incision and let me know what it looks like. Patient removed bandage and stated that her incision is closed and has bandages on it. No evidence of bleeding noticed per patient. I advised her that if she develops bleeding, or signs of infection to come to [facility]. Patient had no further questions.

## 2014-10-04 ENCOUNTER — Telehealth: Payer: Self-pay | Admitting: General Practice

## 2014-10-04 NOTE — Telephone Encounter (Signed)
Patient called and left message stating she would like a refill on her medication. Called patient and she states she would like a refill on the percocet. States she has been taking the percocet and the motrin around the clock and when she gets out of bed or gets up from sitting it is painful. Told patient that we want her taking the motrin every 6 hours and the percocet is one pill at a time in addition to the motrin IF she needs it. If she finds 1 tablet isn't enough she can take 2 but the percocet isn't to be taken around the clock. Told patient she should gradually increase her activity, she doesn't need to over do it but she does need to be up and moving and walking around. Discussed with patient normal aches and pains after a c-section versus severe pain in which she would need to come in for. Told patient that lying down all the time in bed will increase aches and pains. Patient verbalized understanding and has no other questions at this time.

## 2014-10-07 ENCOUNTER — Telehealth: Payer: Self-pay

## 2014-10-07 NOTE — Telephone Encounter (Signed)
Patient called stating she has questions about mastitis. Called patient who explains that she gets cold at night and sweats to the point where breasts are soaked-- reports sweats all day for the past 2 weeks. Reports pain in both breasts, denies any red areas or streaks on either breast. Reports having a fever for the past 4 days but does not have one today. Patient states she was pumping but has decided to stop. Explained to patient that it does not sound like mastitis--instead she may be engorged-- and that her sweats may be hormonal or may have been due to a cold/virus with the fever. Advised patient to continue to monitor and discussed ways to prevent engorgement/help with pain. Patient verbalized understanding and gratitude. No questions or concerns.

## 2014-10-21 ENCOUNTER — Telehealth: Payer: Self-pay | Admitting: *Deleted

## 2014-10-21 NOTE — Telephone Encounter (Signed)
Pt called the clinic requesting paperwork to return to work.    Contacted patient, pt request letter through [facility], stating her dates to be out of work for cesarean section.    Letter sent through MyChart.

## 2014-10-25 ENCOUNTER — Encounter: Payer: Self-pay | Admitting: *Deleted

## 2014-10-31 ENCOUNTER — Telehealth: Payer: Self-pay | Admitting: *Deleted

## 2014-10-31 MED ORDER — IBUPROFEN 600 MG PO TABS: 600.0000 mg | ORAL_TABLET | Freq: Four times a day (QID) | ORAL | Status: DC

## 2014-10-31 NOTE — Telephone Encounter (Addendum)
Pt left message requesting refill of ibuprofen 800 mg. I returned pt's call and advised her that Dr. Shawnie PonsPratt has authorized a refill and has been sent to her pharmacy. Her dose is 600 mg (not 800 mg) every 6hrs as needed. She stated that she still has pain at her incision when she is being active. I advised that she may be doing too much and she should alternate rest with her activities. She is not fully healed yet but should have much less discomfort than immediately after her surgery. She endorsed that her pain is much less. I also advised that she can take Tylenol between the doses of ibuprofen if desired. Her next clinic appt is 1/27.  Pt voiced understanding of all instructions and information given.

## 2014-11-13 ENCOUNTER — Encounter: Payer: Self-pay | Admitting: Obstetrics and Gynecology

## 2014-11-13 ENCOUNTER — Ambulatory Visit (INDEPENDENT_AMBULATORY_CARE_PROVIDER_SITE_OTHER): Payer: Medicaid Other | Admitting: Obstetrics and Gynecology

## 2014-11-13 DIAGNOSIS — Z3042 Encounter for surveillance of injectable contraceptive: Secondary | ICD-10-CM

## 2014-11-13 MED ORDER — MEDROXYPROGESTERONE ACETATE 104 MG/0.65ML ~~LOC~~ SUSP
104.0000 mg | Freq: Once | SUBCUTANEOUS | Status: AC
  Administered 2014-11-13: 104 mg via SUBCUTANEOUS

## 2014-11-13 NOTE — Progress Notes (Signed)
  Subjective:     Christine Cooke is a 29 y.o. female who presents for a postpartum visit. She is 6 weeks postpartum following a low cervical transverse Cesarean section. I have fully reviewed the prenatal and intrapartum course. The delivery was at 39 gestational weeks. Outcome: repeat cesarean section, low transverse incision. Anesthesia: spinal. Postpartum course has been uncomplicated. Baby's course has been uncomplicated. Baby is feeding by bottle - Similac Advance. Bleeding thin lochia. Bowel function is normal. Bladder function is normal. Patient is sexually active. Contraception method is tubal ligation. Postpartum depression screening: negative. She reports receiving little help. She lives with her 29 year old and the baby. She admits to feeling overwhelmed at times but feels that things are getting better     Review of Systems A comprehensive review of systems was negative.   Objective:    There were no vitals taken for this visit.  General:  alert, cooperative and no distress   Breasts:  inspection negative, no nipple discharge or bleeding, no masses or nodularity palpable  Lungs: clear to auscultation bilaterally  Heart:  regular rate and rhythm  Abdomen: soft, non-tender; bowel sounds normal; no masses,  no organomegaly and incision: healed well, no erythema, induration or drainage   Vulva:  normal  Vagina: normal vagina, no discharge, exudate, lesion, or erythema  Cervix:  no cervical motion tenderness and no lesions  Corpus: normal size, contour, position, consistency, mobility, non-tender  Adnexa:  no mass, fullness, tenderness  Rectal Exam: Not performed.        Assessment:     Normal postpartum exam. Pap smear not done at today's visit.   Plan:    1. Contraception: tubal ligation 2. Patient is medically cleared to resume all activities of daily living. Patient desires to have depo-provera to help control her cycles 3. Follow up in: several weeks for repeat  depo-provera or as needed.

## 2014-11-13 NOTE — Addendum Note (Signed)
Addended by: Candelaria StagersHAIZLIP, Donovyn Guidice E on: 11/13/2014 01:30 PM   Modules accepted: Orders, Level of Service

## 2015-01-14 ENCOUNTER — Emergency Department (HOSPITAL_COMMUNITY)
Admission: EM | Admit: 2015-01-14 | Discharge: 2015-01-14 | Disposition: A | Discharge status: Home / Self Care | Payer: Medicaid Other | Attending: Emergency Medicine | Admitting: Emergency Medicine

## 2015-01-14 ENCOUNTER — Encounter (HOSPITAL_COMMUNITY): Payer: Self-pay | Admitting: Neurology

## 2015-01-14 ENCOUNTER — Emergency Department (HOSPITAL_COMMUNITY): Payer: Medicaid Other

## 2015-01-14 DIAGNOSIS — Z862 Personal history of diseases of the blood and blood-forming organs and certain disorders involving the immune mechanism: Secondary | ICD-10-CM | POA: Insufficient documentation

## 2015-01-14 DIAGNOSIS — Z87891 Personal history of nicotine dependence: Secondary | ICD-10-CM | POA: Diagnosis not present

## 2015-01-14 DIAGNOSIS — S161XXA Strain of muscle, fascia and tendon at neck level, initial encounter: Secondary | ICD-10-CM | POA: Insufficient documentation

## 2015-01-14 DIAGNOSIS — Z8742 Personal history of other diseases of the female genital tract: Secondary | ICD-10-CM | POA: Diagnosis not present

## 2015-01-14 DIAGNOSIS — S4992XA Unspecified injury of left shoulder and upper arm, initial encounter: Secondary | ICD-10-CM | POA: Insufficient documentation

## 2015-01-14 DIAGNOSIS — Y999 Unspecified external cause status: Secondary | ICD-10-CM | POA: Insufficient documentation

## 2015-01-14 DIAGNOSIS — B029 Zoster without complications: Secondary | ICD-10-CM | POA: Insufficient documentation

## 2015-01-14 DIAGNOSIS — Y939 Activity, unspecified: Secondary | ICD-10-CM | POA: Diagnosis not present

## 2015-01-14 DIAGNOSIS — Y92410 Unspecified street and highway as the place of occurrence of the external cause: Secondary | ICD-10-CM | POA: Insufficient documentation

## 2015-01-14 DIAGNOSIS — S199XXA Unspecified injury of neck, initial encounter: Secondary | ICD-10-CM | POA: Diagnosis present
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
## 2015-01-14 MED ORDER — HYDROCODONE-ACETAMINOPHEN 5-325 MG PO TABS: 1.0000 | ORAL_TABLET | Freq: Four times a day (QID) | ORAL | Status: DC | PRN

## 2015-01-14 MED ORDER — IBUPROFEN 800 MG PO TABS: 800.0000 mg | ORAL_TABLET | Freq: Three times a day (TID) | ORAL | Status: DC | PRN

## 2015-01-14 MED ORDER — VALACYCLOVIR HCL 1 G PO TABS: 1000.0000 mg | ORAL_TABLET | Freq: Three times a day (TID) | ORAL | Status: AC

## 2015-01-14 NOTE — ED Notes (Signed)
NAD at this time. Pt is stable and going home.  

## 2015-01-14 NOTE — Discharge Instructions (Signed)
Return here as needed. Follow up with your doctor. °
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
## 2015-02-03 ENCOUNTER — Ambulatory Visit: Payer: Medicaid Other

## 2015-04-09 ENCOUNTER — Emergency Department (HOSPITAL_COMMUNITY)
Admission: EM | Admit: 2015-04-09 | Discharge: 2015-04-09 | Disposition: A | Discharge status: Home / Self Care | Payer: Medicaid Other | Attending: Emergency Medicine | Admitting: Emergency Medicine

## 2015-04-09 ENCOUNTER — Encounter (HOSPITAL_COMMUNITY): Payer: Self-pay | Admitting: Nurse Practitioner

## 2015-04-09 DIAGNOSIS — Z8742 Personal history of other diseases of the female genital tract: Secondary | ICD-10-CM | POA: Insufficient documentation

## 2015-04-09 DIAGNOSIS — K088 Other specified disorders of teeth and supporting structures: Secondary | ICD-10-CM | POA: Diagnosis present

## 2015-04-09 DIAGNOSIS — K0520 Aggressive periodontitis, unspecified: Secondary | ICD-10-CM | POA: Diagnosis not present

## 2015-04-09 DIAGNOSIS — Z862 Personal history of diseases of the blood and blood-forming organs and certain disorders involving the immune mechanism: Secondary | ICD-10-CM | POA: Insufficient documentation

## 2015-04-09 DIAGNOSIS — K029 Dental caries, unspecified: Secondary | ICD-10-CM

## 2015-04-09 DIAGNOSIS — Z87891 Personal history of nicotine dependence: Secondary | ICD-10-CM | POA: Insufficient documentation

## 2015-04-09 DIAGNOSIS — R51 Headache: Secondary | ICD-10-CM | POA: Insufficient documentation

## 2015-04-09 MED ORDER — AMOXICILLIN 500 MG PO CAPS: 500.0000 mg | ORAL_CAPSULE | Freq: Three times a day (TID) | ORAL | Status: DC

## 2015-04-09 MED ORDER — TRAMADOL HCL 50 MG PO TABS: 50.0000 mg | ORAL_TABLET | Freq: Four times a day (QID) | ORAL | Status: DC | PRN

## 2015-04-09 NOTE — ED Provider Notes (Signed)
CSN: [id number]     Arrival date & time 04/09/15  1305 History  This chart was scribed for non-physician practitioner, [facility] M. Damian Leavell, NP,  working with Samuel Jester, DO, by Budd Palmer ED Scribe. This patient was seen in room TR02C/TR02C and the patient's care was started at 1:38 PM     Chief Complaint  Patient presents with  . Dental Pain   The history is provided by the patient. No language interpreter was used.   HPI Comments: Christine Cooke is a 29 y.o. female who presents to the Emergency Department complaining of upper left-sided, throbbing, dental pain. She describes it as a "bunch of cotton balls" in her mouth, and rates it as a 9/10. She reports associated left-sided headache, left-sided facial pain, and swelling. Pain is exacerbated by eating cold foods, sweet foods, and chewing, which causes sharp pain. She denies ear ache, cough, congestion, and trouble swallowing. She has been taking ibuprofen for the pain with mild relief. She reports NKDA.  Past Medical History  Diagnosis Date  . Ovarian cyst   . GERD (gastroesophageal reflux disease)     with pregnancy  . Anemia    Past Surgical History  Procedure Laterality Date  . Cesarean section    . Induced abortion    . Cesarean section with bilateral tubal ligation  09/27/2014    Procedure: CESAREAN SECTION WITH BILATERAL TUBAL LIGATION;  Surgeon: Catalina Antigua, MD;  Location: [facility];  Service: Obstetrics;;  . Tubal ligation     Family History  Problem Relation Age of Onset  . Hypertension Mother   . Sickle cell anemia Sister   . Diabetes Maternal Aunt   . Hypertension Maternal Aunt   . Stroke Maternal Grandmother   . Asthma Maternal Grandmother   . Hypertension Maternal Grandmother   . Hyperlipidemia Maternal Grandmother   . Heart disease Maternal Grandmother    History  Substance Use Topics  . Smoking status: Former Smoker -- 0.25 packs/day for 2 years    Types: Cigarettes    Quit date: 09/25/2009  .  Smokeless tobacco: Never Used  . Alcohol Use: Yes     Comment: rare, none since pregnancy   OB History    Gravida Para Term Preterm AB TAB SAB Ectopic Multiple Living   0 4     Review of Systems  HENT: Positive for dental problem and facial swelling. Negative for congestion, ear pain and trouble swallowing.   Respiratory: Negative for cough.   Neurological: Positive for headaches.  all other systems negative  Allergies  Review of patient's allergies indicates no known allergies.  Home Medications   Prior to Admission medications   Medication Sig Start Date End Date Taking? Authorizing Provider  amoxicillin (AMOXIL) 500 MG capsule Take 1 capsule (500 mg total) by mouth 3 (three) times daily. 04/09/15   Hope Orlene Och, NP  HYDROcodone-acetaminophen (NORCO/VICODIN) 5-325 MG per tablet Take 1 tablet by mouth every 6 (six) hours as needed for moderate pain. 01/14/15   Charlestine Night, PA-C  ibuprofen (ADVIL,MOTRIN) 800 MG tablet Take 1 tablet (800 mg total) by mouth every 8 (eight) hours as needed. 01/14/15   Charlestine Night, PA-C  traMADol (ULTRAM) 50 MG tablet Take 1 tablet (50 mg total) by mouth every 6 (six) hours as needed. 04/09/15   Hope Orlene Och, NP   BP 132/81 mmHg  Pulse 78  Temp(Src) 98.9 F (37.2 C) (Oral)  Resp 22  Ht 5\' 6"  (1.676 m)  Wt 175 lb (79.379 kg)  BMI 28.26 kg/m2  SpO2 99% Physical Exam  Constitutional: She is oriented to person, place, and time. She appears well-developed and well-nourished. No distress.  HENT:  Head: Normocephalic and atraumatic.  Mouth/Throat: Oropharynx is clear and moist. No oropharyngeal exudate.  Uvula is midline, no edema or erythema. Third molar partially erupted, small area of decay noted, swelling and erythema of the gum surrounding the tooth.   Left-sided cervical lymphadenopathy and mild left-sided facial swelling and tenderness.  Eyes: Conjunctivae and EOM are normal. Pupils are equal, round, and reactive to  light.  Neck: Normal range of motion. Neck supple.  Cardiovascular: Normal rate and regular rhythm.   Pulmonary/Chest: Effort normal and breath sounds normal. No respiratory distress. She has no wheezes. She has no rales.  Musculoskeletal: Normal range of motion.  Lymphadenopathy:    She has cervical adenopathy.  Neurological: She is alert and oriented to person, place, and time.  Skin: Skin is warm and dry.  Psychiatric: She has a normal mood and affect. Her behavior is normal.  Nursing note and vitals reviewed.   ED Course  Procedures  DIAGNOSTIC STUDIES: Oxygen Saturation is 99% on RA, normal by my interpretation.    COORDINATION OF CARE: 1:36 PM - Discussed plans to order antibiotics, pain medication and anti-inflammatory medication. Advised to follow up with dentist, possibly an oral surgeon. Pt advised of plan for treatment and pt agrees.  Labs  MDM  29 y.o. female with dental pain stable for d/c without fever and does not appear toxic. Discussed with the patient and all questioned fully answered. She will follow up with a dentist as soon as possible or return here if any problems arise.  Final diagnoses:  Acute pericoronitis  Pain due to dental caries   I personally performed the services described in this documentation, which was scribed in my presence. The recorded information has been reviewed and is accurate.   [facility] Orlene Och, NP 04/14/15 1604  Samuel Jester, DO 04/15/15 [phone]

## 2015-04-09 NOTE — ED Notes (Signed)
She c/o L sided dental pain x 2 days. A&Ox4, resp e/u

## 2015-04-09 NOTE — Discharge Instructions (Signed)
Dental Caries  Dental caries (also called tooth decay) is the most common oral disease. It can occur at any age but is more common in children and young adults.   HOW DENTAL CARIES DEVELOPS   The process of decay begins when bacteria and foods (particularly sugars and starches) combine in your mouth to produce plaque. Plaque is a substance that sticks to the hard, outer surface of a tooth (enamel). The bacteria in plaque produce acids that attack enamel. These acids may also attack the root surface of a tooth (cementum) if it is exposed. Repeated attacks dissolve these surfaces and create holes in the tooth (cavities). If left untreated, the acids destroy the other layers of the tooth.   RISK FACTORS   Frequent sipping of sugary beverages.    Frequent snacking on sugary and starchy foods, especially those that easily get stuck in the teeth.    Poor oral hygiene.    Dry mouth.    Substance abuse such as methamphetamine abuse.    Broken or poor-fitting dental restorations.    Eating disorders.    Gastroesophageal reflux disease (GERD).    Certain radiation treatments to the head and neck.  SYMPTOMS  In the early stages of dental caries, symptoms are seldom present. Sometimes white, chalky areas may be seen on the enamel or other tooth layers. In later stages, symptoms may include:   Pits and holes on the enamel.   Toothache after sweet, hot, or cold foods or drinks are consumed.   Pain around the tooth.   Swelling around the tooth.  DIAGNOSIS   Most of the time, dental caries is detected during a regular dental checkup. A diagnosis is made after a thorough medical and dental history is taken and the surfaces of your teeth are checked for signs of dental caries. Sometimes special instruments, such as lasers, are used to check for dental caries. Dental X-ray exams may be taken so that areas not visible to the eye (such as between the contact areas of the teeth) can be checked for cavities.    TREATMENT   If dental caries is in its early stages, it may be reversed with a fluoride treatment or an application of a remineralizing agent at the dental office. Thorough brushing and flossing at home is needed to aid these treatments. If it is in its later stages, treatment depends on the location and extent of tooth destruction:    If a small area of the tooth has been destroyed, the destroyed area will be removed and cavities will be filled with a material such as gold, silver amalgam, or composite resin.    If a large area of the tooth has been destroyed, the destroyed area will be removed and a cap (crown) will be fitted over the remaining tooth structure.    If the center part of the tooth (pulp) is affected, a procedure called a root canal will be needed before a filling or crown can be placed.    If most of the tooth has been destroyed, the tooth may need to be pulled (extracted).  HOME CARE INSTRUCTIONS  You can prevent, stop, or reverse dental caries at home by practicing good oral hygiene. Good oral hygiene includes:   Thoroughly cleaning your teeth at least twice a day with a toothbrush and dental floss.    Using a fluoride toothpaste. A fluoride mouth rinse may also be used if recommended by your dentist or health care provider.    Restricting   the amount of sugary and starchy foods and sugary liquids you consume.    Avoiding frequent snacking on these foods and sipping of these liquids.    Keeping regular visits with a dentist for checkups and cleanings.  PREVENTION    Practice good oral hygiene.   Consider a dental sealant. A dental sealant is a coating material that is applied by your dentist to the pits and grooves of teeth. The sealant prevents food from being trapped in them. It may protect the teeth for several years.   Ask about fluoride supplements if you live in a community without fluorinated water or with water that has a low fluoride content. Use fluoride supplements  as directed by your dentist or health care provider.   Allow fluoride varnish applications to teeth if directed by your dentist or health care provider.  Document Released: 06/26/2002 Document Revised: 02/18/2014 Document Reviewed: 10/06/2012  Exit[employer] Patient Information 2015 [employer]. This information is not intended to replace advice given to you by your health care provider. Make sure you discuss any questions you have with your health care provider.

## 2015-04-09 NOTE — ED Notes (Signed)
Declined W/C at D/C and was escorted to lobby by RN. 

## 2015-08-10 ENCOUNTER — Inpatient Hospital Stay (HOSPITAL_COMMUNITY)
Admission: AD | Admit: 2015-08-10 | Discharge: 2015-08-10 | Disposition: A | Discharge status: Home / Self Care | Payer: Self-pay | Source: Ambulatory Visit | Attending: Obstetrics and Gynecology | Admitting: Obstetrics and Gynecology

## 2015-08-10 ENCOUNTER — Encounter (HOSPITAL_COMMUNITY): Payer: Self-pay | Admitting: *Deleted

## 2015-08-10 DIAGNOSIS — Z87891 Personal history of nicotine dependence: Secondary | ICD-10-CM | POA: Insufficient documentation

## 2015-08-10 DIAGNOSIS — K219 Gastro-esophageal reflux disease without esophagitis: Secondary | ICD-10-CM | POA: Insufficient documentation

## 2015-08-10 DIAGNOSIS — N926 Irregular menstruation, unspecified: Secondary | ICD-10-CM | POA: Insufficient documentation

## 2015-08-10 DIAGNOSIS — K5900 Constipation, unspecified: Secondary | ICD-10-CM | POA: Insufficient documentation

## 2015-08-10 HISTORY — DX: Unspecified infectious disease: B99.9

## 2015-08-10 LAB — COMPREHENSIVE METABOLIC PANEL
ALK PHOS: 84 U/L (ref 38–126)
ALT: 16 U/L (ref 14–54)
AST: 17 U/L (ref 15–41)
Albumin: 4.1 g/dL (ref 3.5–5.0)
Anion gap: 4 — ABNORMAL LOW (ref 5–15)
BUN: 10 mg/dL (ref 6–20)
CHLORIDE: 108 mmol/L (ref 101–111)
CO2: 25 mmol/L (ref 22–32)
CREATININE: 0.74 mg/dL (ref 0.44–1.00)
Calcium: 8.6 mg/dL — ABNORMAL LOW (ref 8.9–10.3)
Glucose, Bld: 103 mg/dL — ABNORMAL HIGH (ref 65–99)
Potassium: 3.7 mmol/L (ref 3.5–5.1)
SODIUM: 137 mmol/L (ref 135–145)
Total Bilirubin: 1.1 mg/dL (ref 0.3–1.2)
Total Protein: 7.8 g/dL (ref 6.5–8.1)

## 2015-08-10 LAB — URINALYSIS, ROUTINE W REFLEX MICROSCOPIC
Bilirubin Urine: NEGATIVE
Glucose, UA: NEGATIVE mg/dL
Ketones, ur: NEGATIVE mg/dL
LEUKOCYTES UA: NEGATIVE
Nitrite: NEGATIVE
PH: 6 (ref 5.0–8.0)
Protein, ur: NEGATIVE mg/dL
Specific Gravity, Urine: >1.03 — ABNORMAL HIGH (ref 1.005–1.030)
Urobilinogen, UA: 0.2 mg/dL (ref 0.0–1.0)

## 2015-08-10 LAB — CBC
HCT: 35.8 % — ABNORMAL LOW (ref 36.0–46.0)
Hemoglobin: 11.5 g/dL — ABNORMAL LOW (ref 12.0–15.0)
MCH: 27.6 pg (ref 26.0–34.0)
MCHC: 32.1 g/dL (ref 30.0–36.0)
MCV: 86.1 fL (ref 78.0–100.0)
Platelets: 200 10*3/uL (ref 150–400)
RBC: 4.16 MIL/uL (ref 3.87–5.11)
RDW: 15.5 % (ref 11.5–15.5)
WBC: 8 10*3/uL (ref 4.0–10.5)

## 2015-08-10 LAB — POCT PREGNANCY, URINE: Preg Test, Ur: NEGATIVE

## 2015-08-10 LAB — URINE MICROSCOPIC-ADD ON

## 2015-08-10 LAB — AMYLASE: AMYLASE: 145 U/L — AB (ref 28–100)

## 2015-08-10 LAB — LIPASE, BLOOD: Lipase: 46 U/L (ref 11–51)

## 2015-08-10 MED ORDER — DOCUSATE SODIUM 100 MG PO CAPS
100.0000 mg | ORAL_CAPSULE | Freq: Two times a day (BID) | ORAL | Status: DC | PRN
Start: 2015-08-10 — End: 2016-02-09

## 2015-08-10 MED ORDER — OMEPRAZOLE 20 MG PO CPDR: 20.0000 mg | DELAYED_RELEASE_CAPSULE | Freq: Every day | ORAL | Status: DC

## 2015-08-10 MED ORDER — GI COCKTAIL ~~LOC~~
30.0000 mL | Freq: Once | ORAL | Status: AC
  Administered 2015-08-10: 30 mL via ORAL
  Filled 2015-08-10: qty 30

## 2015-08-10 NOTE — Discharge Instructions (Signed)
Instructions:  --Follow up as soon as possible with a primary care provider if abdominal pain continues --Follow up with [facility], call [phone], for abnormal periods/pelvic pain --Take Prilosec (omeprazole) 20 mg daily for 2 weeks.   --Then, take Zantac (randitidine) 150 mg or Pepcid (famotidine) 10 or 20 mg twice per day as needed.   --Increase the amount of fluids you are drinking and eat a high fiber diet (see below).  You may want to stir in fiber by using Metamucil daily as needed. --Try to avoid fried/spicy/acidic foods (see GERD diet below) to see if symptoms improve.   Gastroesophageal Reflux Disease, Adult Normally, food travels down the esophagus and stays in the stomach to be digested. However, when a person has gastroesophageal reflux disease (GERD), food and stomach acid move back up into the esophagus. When this happens, the esophagus becomes sore and inflamed. Over time, GERD can create small holes (ulcers) in the lining of the esophagus.  CAUSES This condition is caused by a problem with the muscle between the esophagus and the stomach (lower esophageal sphincter, or LES). Normally, the LES muscle closes after food passes through the esophagus to the stomach. When the LES is weakened or abnormal, it does not close properly, and that allows food and stomach acid to go back up into the esophagus. The LES can be weakened by certain dietary substances, medicines, and medical conditions, including:  Tobacco use.  Pregnancy.  Having a hiatal hernia.  Heavy alcohol use.  Certain foods and beverages, such as coffee, chocolate, onions, and peppermint. RISK FACTORS This condition is more likely to develop in:  People who have an increased body weight.  People who have connective tissue disorders.  People who use NSAID medicines. SYMPTOMS Symptoms of this condition include:  Heartburn.  Difficult or painful swallowing.  The feeling of having a lump  in the throat.  Abitter taste in the mouth.  Bad breath.  Having a large amount of saliva.  Having an upset or bloated stomach.  Belching.  Chest pain.  Shortness of breath or wheezing.  Ongoing (chronic) cough or a night-time cough.  Wearing away of tooth enamel.  Weight loss. Different conditions can cause chest pain. Make sure to see your health care provider if you experience chest pain. DIAGNOSIS Your health care provider will take a medical history and perform a physical exam. To determine if you have mild or severe GERD, your health care provider may also monitor how you respond to treatment. You may also have other tests, including:  An endoscopy toexamine your stomach and esophagus with a small camera.  A test thatmeasures the acidity level in your esophagus.  A test thatmeasures how much pressure is on your esophagus.  A barium swallow or modified barium swallow to show the shape, size, and functioning of your esophagus. TREATMENT The goal of treatment is to help relieve your symptoms and to prevent complications. Treatment for this condition may vary depending on how severe your symptoms are. Your health care provider may recommend:  Changes to your diet.  Medicine.  Surgery. HOME CARE INSTRUCTIONS Diet  Follow a diet as recommended by your health care provider. This may involve avoiding foods and drinks such as:  Coffee and tea (with or without caffeine).  Drinks that containalcohol.  Energy drinks and sports drinks.  Carbonated drinks or sodas.  Chocolate and cocoa.  Peppermint and mint flavorings.  Garlic and onions.  Horseradish.  Spicy and acidic foods, including  peppers, chili powder, curry powder, vinegar, hot sauces, and barbecue sauce.  Citrus fruit juices and citrus fruits, such as oranges, lemons, and limes.  Tomato-based foods, such as red sauce, chili, salsa, and pizza with red sauce.  Fried and fatty foods, such as  donuts, french fries, potato chips, and high-fat dressings.  High-fat meats, such as hot dogs and fatty cuts of red and white meats, such as rib eye steak, sausage, ham, and bacon.  High-fat dairy items, such as whole milk, butter, and cream cheese.  Eat small, frequent meals instead of large meals.  Avoid drinking large amounts of liquid with your meals.  Avoid eating meals during the 2-3 hours before bedtime.  Avoid lying down right after you eat.  Do not exercise right after you eat. General Instructions  Pay attention to any changes in your symptoms.  Take over-the-counter and prescription medicines only as told by your health care provider. Do not take aspirin, ibuprofen, or other NSAIDs unless your health care provider told you to do so.  Do not use any tobacco products, including cigarettes, chewing tobacco, and e-cigarettes. If you need help quitting, ask your health care provider.  Wear loose-fitting clothing. Do not wear anything tight around your waist that causes pressure on your abdomen.  Raise (elevate) the head of your bed 6 inches (15cm).  Try to reduce your stress, such as with yoga or meditation. If you need help reducing stress, ask your health care provider.  If you are overweight, reduce your weight to an amount that is healthy for you. Ask your health care provider for guidance about a safe weight loss goal.  Keep all follow-up visits as told by your health care provider. This is important. SEEK MEDICAL CARE IF:  You have new symptoms.  You have unexplained weight loss.  You have difficulty swallowing, or it hurts to swallow.  You have wheezing or a persistent cough.  Your symptoms do not improve with treatment.  You have a hoarse voice. SEEK IMMEDIATE MEDICAL CARE IF:  You have pain in your arms, neck, jaw, teeth, or back.  You feel sweaty, dizzy, or light-headed.  You have chest pain or shortness of breath.  You vomit and your vomit looks  like blood or coffee grounds.  You faint.  Your stool is bloody or black.  You cannot swallow, drink, or eat.   This information is not intended to replace advice given to you by your health care provider. Make sure you discuss any questions you have with your health care provider.   Document Released: 07/14/2005 Document Revised: 06/25/2015 Document Reviewed: 01/29/2015 Elsevier Interactive Patient Education 2016 [employer].  Food Choices for Gastroesophageal Reflux Disease, Adult When you have gastroesophageal reflux disease (GERD), the foods you eat and your eating habits are very important. Choosing the right foods can help ease the discomfort of GERD. WHAT GENERAL GUIDELINES DO I NEED TO FOLLOW?  Choose fruits, vegetables, whole grains, low-fat dairy products, and low-fat meat, fish, and poultry.  Limit fats such as oils, salad dressings, butter, nuts, and avocado.  Keep a food diary to identify foods that cause symptoms.  Avoid foods that cause reflux. These may be different for different people.  Eat frequent small meals instead of three large meals each day.  Eat your meals slowly, in a relaxed setting.  Limit fried foods.  Cook foods using methods other than frying.  Avoid drinking alcohol.  Avoid drinking large amounts of liquids with your meals.  Avoid bending over or lying down until 2-3 hours after eating. WHAT FOODS ARE NOT RECOMMENDED? The following are some foods and drinks that may worsen your symptoms: Vegetables Tomatoes. Tomato juice. Tomato and spaghetti sauce. Chili peppers. Onion and garlic. Horseradish. Fruits Oranges, grapefruit, and lemon (fruit and juice). Meats High-fat meats, fish, and poultry. This includes hot dogs, ribs, ham, sausage, salami, and bacon. Dairy Whole milk and chocolate milk. Sour cream. Cream. Butter. Ice cream. Cream cheese.  Beverages Coffee and tea, with or without caffeine. Carbonated beverages or energy  drinks. Condiments Hot sauce. Barbecue sauce.  Sweets/Desserts Chocolate and cocoa. Donuts. Peppermint and spearmint. Fats and Oils High-fat foods, including [place] fries and potato chips. Other Vinegar. Strong spices, such as black pepper, white pepper, red pepper, cayenne, curry powder, cloves, ginger, and chili powder. The items listed above may not be a complete list of foods and beverages to avoid. Contact your dietitian for more information.   This information is not intended to replace advice given to you by your health care provider. Make sure you discuss any questions you have with your health care provider.   Document Released: 10/04/2005 Document Revised: 10/25/2014 Document Reviewed: 08/08/2013 Elsevier Interactive Patient Education 2016 Else[employer] Inc[employer]  High-Fiber Diet Fiber, also called dietary fiber, is a type of carbohydrate found in fruits, vegetables, whole grains, and beans. A high-fiber diet can have many health benefits. Your health care provider may recommend a high-fiber diet to help:  Prevent constipation. Fiber can make your bowel movements more regular.  Lower your cholesterol.  Relieve hemorrhoids, uncomplicated diverticulosis, or irritable bowel syndrome.  Prevent overeating as part of a weight-loss plan.  Prevent heart disease, type 2 diabetes, and certain cancers. WHAT IS MY PLAN? The recommended daily intake of fiber includes:  38 grams for men under age 950.  30 grams for men over age 29.  25 grams for women under age 29.  21 grams for women over age 29. You can get the recommended daily intake of dietary fiber by eating a variety of fruits, vegetables, grains, and beans. Your health care provider may also recommend a fiber supplement if it is not possible to get enough fiber through your diet. WHAT DO I NEED TO KNOW ABOUT A HIGH-FIBER DIET?  Fiber supplements have not been widely studied for their effectiveness, so it is better to get fiber  through food sources.  Always check the fiber content on thenutrition facts label of any prepackaged food. Look for foods that contain at least 5 grams of fiber per serving.  Ask your dietitian if you have questions about specific foods that are related to your condition, especially if those foods are not listed in the following section.  Increase your daily fiber consumption gradually. Increasing your intake of dietary fiber too quickly may cause bloating, cramping, or gas.  Drink plenty of water. Water helps you to digest fiber. WHAT FOODS CAN I EAT? Grains Whole-grain breads. Multigrain cereal. Oats and oatmeal. Brown rice. Barley. Bulgur wheat. Millet. Bran muffins. Popcorn. Rye wafer crackers. Vegetables Sweet potatoes. Spinach. Kale. Artichokes. Cabbage. Broccoli. Green peas. Carrots. Squash. Fruits Berries. Pears. Apples. Oranges. Avocados. Prunes and raisins. Dried figs. Meats and Other Protein Sources Navy, kidney, pinto, and soy beans. Split peas. Lentils. Nuts and seeds. Dairy Fiber-fortified yogurt. Beverages Fiber-fortified soy milk. Fiber-fortified orange juice. Other Fiber bars. The items listed above may not be a complete list of recommended foods or beverages. Contact your dietitian for more options. WHAT FOODS ARE  NOT RECOMMENDED? Grains White bread. Pasta made with refined flour. White rice. Vegetables Fried potatoes. Canned vegetables. Well-cooked vegetables.  Fruits Fruit juice. Cooked, strained fruit. Meats and Other Protein Sources Fatty cuts of meat. Fried Environmental education officer or fried fish. Dairy Milk. Yogurt. Cream cheese. Sour cream. Beverages Soft drinks. Other Cakes and pastries. Butter and oils. The items listed above may not be a complete list of foods and beverages to avoid. Contact your dietitian for more information. WHAT ARE SOME TIPS FOR INCLUDING HIGH-FIBER FOODS IN MY DIET?  Eat a wide variety of high-fiber foods.  Make sure that half of all  grains consumed each day are whole grains.  Replace breads and cereals made from refined flour or white flour with whole-grain breads and cereals.  Replace white rice with brown rice, bulgur wheat, or millet.  Start the day with a breakfast that is high in fiber, such as a cereal that contains at least 5 grams of fiber per serving.  Use beans in place of meat in soups, salads, or pasta.  Eat high-fiber snacks, such as berries, raw vegetables, nuts, or popcorn.   This information is not intended to replace advice given to you by your health care provider. Make sure you discuss any questions you have with your health care provider.   Document Released: 10/04/2005 Document Revised: 10/25/2014 Document Reviewed: 03/19/2014 Elsevier Interactive Patient Education [employer]

## 2015-08-10 NOTE — MAU Provider Note (Signed)
History     CSN: [id number]  Arrival date and time: 08/10/15 1219   First Provider Initiated Contact with Patient 08/10/15 [phone]      Chief Complaint  Patient presents with  . Abdominal Pain   HPI Ms. Christine Cooke is a 29 y.o. [id number] who presents to MAU today with complaint of upper abdominal pain constantly x 3 days. The patient denies N/V/D, but does endorse significant heartburn and constipation present since pain started. The patient has not tried any medications for pain or heartburn. She denies fever. She states that the pain has become worse and is now radiating to her back.   OB History    Gravida Para Term Preterm AB TAB SAB Ectopic Multiple Living   5 4 4  1 1    0 4      Past Medical History  Diagnosis Date  . Ovarian cyst   . GERD (gastroesophageal reflux disease)     with pregnancy  . Anemia   . Infection     UTI    Past Surgical History  Procedure Laterality Date  . Cesarean section    . Induced abortion    . Cesarean section with bilateral tubal ligation  09/27/2014    Procedure: CESAREAN SECTION WITH BILATERAL TUBAL LIGATION;  Surgeon: Mora Bellman, MD;  Location: [facility];  Service: Obstetrics;;  . Tubal ligation      Family History  Problem Relation Age of Onset  . Hypertension Mother   . Sickle cell anemia Sister   . Asthma Sister   . Diabetes Maternal Aunt   . Hypertension Maternal Aunt   . Stroke Maternal Grandmother   . Hypertension Maternal Grandmother   . Hyperlipidemia Maternal Grandmother   . Heart disease Maternal Grandmother   . Diabetes Maternal Grandmother   . Asthma Son     Social History  Substance Use Topics  . Smoking status: Former Smoker -- 0.25 packs/day for 2 years    Types: Cigarettes    Quit date: 09/25/2009  . Smokeless tobacco: Never Used  . Alcohol Use: Yes     Comment: weekends    Allergies: No Known Allergies  Prescriptions prior to admission  Medication Sig Dispense Refill Last Dose  .  amoxicillin (AMOXIL) 500 MG capsule Take 1 capsule (500 mg total) by mouth 3 (three) times daily. (Patient not taking: Reported on 08/10/2015) 21 capsule 0   . HYDROcodone-acetaminophen (NORCO/VICODIN) 5-325 MG per tablet Take 1 tablet by mouth every 6 (six) hours as needed for moderate pain. (Patient not taking: Reported on 08/10/2015) 15 tablet 0   . ibuprofen (ADVIL,MOTRIN) 800 MG tablet Take 1 tablet (800 mg total) by mouth every 8 (eight) hours as needed. (Patient not taking: Reported on 08/10/2015) 21 tablet 0   . traMADol (ULTRAM) 50 MG tablet Take 1 tablet (50 mg total) by mouth every 6 (six) hours as needed. (Patient not taking: Reported on 08/10/2015) 15 tablet 0     Review of Systems  Constitutional: Negative for fever and malaise/fatigue.  Gastrointestinal: Positive for heartburn, abdominal pain and constipation. Negative for nausea, vomiting and diarrhea.  Genitourinary:       Neg - vaginal bleeding, discharge   Physical Exam   Blood pressure 130/85, pulse 84, temperature 98.6 F (37 C), temperature source Oral, resp. rate 18, weight 204 lb (92.534 kg), not currently breastfeeding.  Physical Exam  Nursing note and vitals reviewed. Constitutional: She is oriented to person, place, and time. She appears well-developed and  well-nourished. No distress.  HENT:  Head: Normocephalic and atraumatic.  Cardiovascular: Normal rate.   Respiratory: Effort normal.  GI: Soft. She exhibits no distension and no mass. There is tenderness (mild tenderness to palpation of the upper abdomen bilaterally). There is no rebound and no guarding.  Neurological: She is alert and oriented to person, place, and time.  Skin: Skin is warm and dry. No erythema.  Psychiatric: She has a normal mood and affect.    Results for orders placed or performed during the hospital encounter of 08/10/15 (from the past 24 hour(s))  Pregnancy, urine POC     Status: None   Collection Time: 08/10/15  7:38 AM  Result  Value Ref Range   Preg Test, Ur NEGATIVE NEGATIVE    MAU Course  Procedures None  MDM UPT - negative UA, CBC, CMP, Amylase and Lipase today  GI Cocktail given in MAU   0800 - Labs pending. Care turned over to [facility], CNM  Luvenia Redden, PA-C  08/10/2015, 8:02 AM  Assessment and Plan   Results for orders placed or performed during the hospital encounter of 08/10/15 (from the past 48 hour(s))  Pregnancy, urine POC     Status: None   Collection Time: 08/10/15  7:38 AM  Result Value Ref Range   Preg Test, Ur NEGATIVE NEGATIVE    Comment:        THE SENSITIVITY OF THIS METHODOLOGY IS >24 mIU/mL   Urinalysis, Routine w reflex microscopic (not at [facility])     Status: Abnormal   Collection Time: 08/10/15  7:50 AM  Result Value Ref Range   Color, Urine YELLOW YELLOW   APPearance CLEAR CLEAR   Specific Gravity, Urine >1.030 (H) 1.005 - 1.030   pH 6.0 5.0 - 8.0   Glucose, UA NEGATIVE NEGATIVE mg/dL   Hgb urine dipstick MODERATE (A) NEGATIVE   Bilirubin Urine NEGATIVE NEGATIVE   Ketones, ur NEGATIVE NEGATIVE mg/dL   Protein, ur NEGATIVE NEGATIVE mg/dL   Urobilinogen, UA 0.2 0.0 - 1.0 mg/dL   Nitrite NEGATIVE NEGATIVE   Leukocytes, UA NEGATIVE NEGATIVE  CBC     Status: Abnormal   Collection Time: 08/10/15  7:50 AM  Result Value Ref Range   WBC 8.0 4.0 - 10.5 K/uL   RBC 4.16 3.87 - 5.11 MIL/uL   Hemoglobin 11.5 (L) 12.0 - 15.0 g/dL   HCT 35.8 (L) 36.0 - 46.0 %   MCV 86.1 78.0 - 100.0 fL   MCH 27.6 26.0 - 34.0 pg   MCHC 32.1 30.0 - 36.0 g/dL   RDW 15.5 11.5 - 15.5 %   Platelets 200 150 - 400 K/uL  Comprehensive metabolic panel     Status: Abnormal   Collection Time: 08/10/15  7:50 AM  Result Value Ref Range   Sodium 137 135 - 145 mmol/L   Potassium 3.7 3.5 - 5.1 mmol/L   Chloride 108 101 - 111 mmol/L   CO2 25 22 - 32 mmol/L   Glucose, Bld 103 (H) 65 - 99 mg/dL   BUN 10 6 - 20 mg/dL   Creatinine, Ser 0.74 0.44 - 1.00 mg/dL   Calcium 8.6 (L) 8.9 - 10.3 mg/dL    Total Protein 7.8 6.5 - 8.1 g/dL   Albumin 4.1 3.5 - 5.0 g/dL   AST 17 15 - 41 U/L   ALT 16 14 - 54 U/L   Alkaline Phosphatase 84 38 - 126 U/L   Total Bilirubin 1.1 0.3 - 1.2 mg/dL  GFR calc non Af Amer >60 >60 mL/min   GFR calc Af Amer >60 >60 mL/min    Comment: (NOTE) The eGFR has been calculated using the CKD EPI equation. This calculation has not been validated in all clinical situations. eGFR's persistently <60 mL/min signify possible Chronic Kidney Disease.    Anion gap 4 (L) 5 - 15  Amylase     Status: Abnormal   Collection Time: 08/10/15  7:50 AM  Result Value Ref Range   Amylase 145 (H) 28 - 100 U/L  Lipase, blood     Status: None   Collection Time: 08/10/15  7:50 AM  Result Value Ref Range   Lipase 46 11 - 51 U/L    Comment: Please note change in reference range.  Urine microscopic-add on     Status: Abnormal   Collection Time: 08/10/15  7:50 AM  Result Value Ref Range   Squamous Epithelial / LPF FEW (A) RARE   WBC, UA 0-2 <3 WBC/hpf   RBC / HPF 0-2 <3 RBC/hpf   Bacteria, UA FEW (A) RARE   MDM:  Discussed slightly elevated Amylase with Dr Glo Herring.  Pt reports complete resolution of symptoms with GI cocktail in MAU.  Stable for discharge at this time but pt should follow up with primary care provider.  A: 1. Gastroesophageal reflux disease without esophagitis   2. Constipation, unspecified constipation type   3. Irregular menses     P: D/C home Prilosec 20 mg daily x 2 weeks, then Pepcid or Zantac PRN Colace/Miralax for constipation, eat high fiber diet, increase PO fluids F/U with primary care if abdominal pain persists. Pt given information on finding primary care provider. F/U with [facility] for routine Gyn care/to evaluate irregular menses Return to MAU as needed for emergencies

## 2015-08-10 NOTE — MAU Note (Signed)
Denies n/v/d.  Is constipated, but on further discussion- she goes daily, just is very hard.  Hx reflux and indigestion. Never had pain like this before

## 2015-08-10 NOTE — MAU Note (Signed)
Cramp in upper abd, started 2 days ago.  Like a menstrual cramp, but she is not coming on, getting worse, starting to radiate to back

## 2016-02-09 ENCOUNTER — Encounter (HOSPITAL_COMMUNITY): Payer: Self-pay | Admitting: *Deleted

## 2016-02-09 ENCOUNTER — Inpatient Hospital Stay (HOSPITAL_COMMUNITY)
Admission: AD | Admit: 2016-02-09 | Discharge: 2016-02-09 | Disposition: A | Discharge status: Home / Self Care | Payer: Medicaid Other | Source: Ambulatory Visit | Attending: Family Medicine | Admitting: Family Medicine

## 2016-02-09 DIAGNOSIS — Z87891 Personal history of nicotine dependence: Secondary | ICD-10-CM | POA: Insufficient documentation

## 2016-02-09 DIAGNOSIS — M545 Low back pain: Secondary | ICD-10-CM | POA: Insufficient documentation

## 2016-02-09 DIAGNOSIS — N939 Abnormal uterine and vaginal bleeding, unspecified: Secondary | ICD-10-CM

## 2016-02-09 DIAGNOSIS — N3000 Acute cystitis without hematuria: Secondary | ICD-10-CM

## 2016-02-09 DIAGNOSIS — N390 Urinary tract infection, site not specified: Secondary | ICD-10-CM | POA: Insufficient documentation

## 2016-02-09 DIAGNOSIS — N946 Dysmenorrhea, unspecified: Secondary | ICD-10-CM | POA: Insufficient documentation

## 2016-02-09 DIAGNOSIS — K219 Gastro-esophageal reflux disease without esophagitis: Secondary | ICD-10-CM | POA: Insufficient documentation

## 2016-02-09 LAB — URINALYSIS, ROUTINE W REFLEX MICROSCOPIC
Glucose, UA: NEGATIVE mg/dL
KETONES UR: 15 mg/dL — AB
NITRITE: POSITIVE — AB
Protein, ur: >300 mg/dL — AB
pH: 6 (ref 5.0–8.0)

## 2016-02-09 LAB — CBC WITH DIFFERENTIAL/PLATELET
Basophils Absolute: 0 10*3/uL (ref 0.0–0.1)
Basophils Relative: 0 %
Eosinophils Absolute: 0.1 10*3/uL (ref 0.0–0.7)
Eosinophils Relative: 1 %
HCT: 32.8 % — ABNORMAL LOW (ref 36.0–46.0)
Hemoglobin: 10.8 g/dL — ABNORMAL LOW (ref 12.0–15.0)
LYMPHS ABS: 3.4 10*3/uL (ref 0.7–4.0)
Lymphocytes Relative: 31 %
MCH: 28.7 pg (ref 26.0–34.0)
MCHC: 32.9 g/dL (ref 30.0–36.0)
MCV: 87.2 fL (ref 78.0–100.0)
MONO ABS: 0.5 10*3/uL (ref 0.1–1.0)
MONOS PCT: 5 %
Neutro Abs: 6.8 10*3/uL (ref 1.7–7.7)
Neutrophils Relative %: 63 %
Platelets: 227 10*3/uL (ref 150–400)
RBC: 3.76 MIL/uL — AB (ref 3.87–5.11)
RDW: 14.2 % (ref 11.5–15.5)
WBC: 10.8 10*3/uL — ABNORMAL HIGH (ref 4.0–10.5)

## 2016-02-09 LAB — URINE MICROSCOPIC-ADD ON: SQUAMOUS EPITHELIAL / LPF: NONE SEEN

## 2016-02-09 LAB — WET PREP, GENITAL
CLUE CELLS WET PREP: NONE SEEN
SPERM: NONE SEEN
TRICH WET PREP: NONE SEEN
YEAST WET PREP: NONE SEEN

## 2016-02-09 MED ORDER — KETOROLAC TROMETHAMINE 60 MG/2ML IM SOLN
60.0000 mg | Freq: Once | INTRAMUSCULAR | Status: AC
  Administered 2016-02-09: 60 mg via INTRAMUSCULAR
  Filled 2016-02-09: qty 2

## 2016-02-09 MED ORDER — CIPROFLOXACIN HCL 250 MG PO TABS: 250.0000 mg | ORAL_TABLET | Freq: Two times a day (BID) | ORAL | Status: DC

## 2016-02-09 MED ORDER — OXYCODONE-ACETAMINOPHEN 5-325 MG PO TABS: 1.0000 | ORAL_TABLET | Freq: Four times a day (QID) | ORAL | Status: DC | PRN

## 2016-02-09 NOTE — Discharge Instructions (Signed)
Dysfunctional Uterine Bleeding Dysfunctional uterine bleeding is abnormal bleeding from the uterus. Dysfunctional uterine bleeding includes:  A period that comes earlier or later than usual.  A period that is lighter, heavier, or has blood clots.  Bleeding between periods.  Skipping one or more periods.  Bleeding after sexual intercourse.  Bleeding after menopause. HOME CARE INSTRUCTIONS  Pay attention to any changes in your symptoms. Follow these instructions to help with your condition: Eating  Eat well-balanced meals. Include foods that are high in iron, such as liver, meat, shellfish, green leafy vegetables, and eggs.  If you become constipated:  Drink plenty of water.  Eat fruits and vegetables that are high in water and fiber, such as spinach, carrots, raspberries, apples, and mango. Medicines  Take over-the-counter and prescription medicines only as told by your health care provider.  Do not change medicines without talking with your health care provider.  Aspirin or medicines that contain aspirin may make the bleeding worse. Do not take those medicines:  During the week before your period.  During your period.  If you were prescribed iron pills, take them as told by your health care provider. Iron pills help to replace iron that your body loses because of this condition. Activity  If you need to change your sanitary pad or tampon more than one time every 2 hours:  Lie in bed with your feet raised (elevated).  Place a cold pack on your lower abdomen.  Rest as much as possible until the bleeding stops or slows down.  Do not try to lose weight until the bleeding has stopped and your blood iron level is back to normal. Other Instructions  For two months, write down:  When your period starts.  When your period ends.  When any abnormal bleeding occurs.  What problems you notice.  Keep all follow up visits as told by your health care provider. This is  important. SEEK MEDICAL CARE IF:  You get light-headed or weak.  You have nausea and vomiting.  You cannot eat or drink without vomiting.  You feel dizzy or have diarrhea while you are taking medicines.  You are taking birth control pills or hormones, and you want to change them or stop taking them. SEEK IMMEDIATE MEDICAL CARE IF:  You develop a fever or chills.  You need to change your sanitary pad or tampon more than one time per hour.  Your bleeding becomes heavier, or your flow contains clots more often.  You develop pain in your abdomen.  You lose consciousness.  You develop a rash.   This information is not intended to replace advice given to you by your health care provider. Make sure you discuss any questions you have with your health care provider.   Document Released: 10/01/2000 Document Revised: 06/25/2015 Document Reviewed: 12/30/2014 Elsevier Interactive Patient Education 2016 Else[employer] Inc.  Dysmenorrhea Menstrual cramps (dysmenorrhea) are caused by the muscles of the uterus tightening (contracting) during a menstrual period. For some women, this discomfort is merely bothersome. For others, dysmenorrhea can be severe enough to interfere with everyday activities for a few days each month. Primary dysmenorrhea is menstrual cramps that last a couple of days when you start having menstrual periods or soon after. This often begins after a teenager starts having her period. As a woman gets older or has a baby, the cramps will usually lessen or disappear. Secondary dysmenorrhea begins later in life, lasts longer, and the pain may be stronger than primary dysmenorrhea. The pain  may start before the period and last a few days after the period.  CAUSES  Dysmenorrhea is usually caused by an underlying problem, such as:  The tissue lining the uterus grows outside of the uterus in other areas of the body (endometriosis).  The endometrial tissue, which normally lines the  uterus, is found in or grows into the muscular walls of the uterus (adenomyosis).  The pelvic blood vessels are engorged with blood just before the menstrual period (pelvic congestive syndrome).  Overgrowth of cells (polyps) in the lining of the uterus or cervix.  Falling down of the uterus (prolapse) because of loose or stretched ligaments.  Depression.  Bladder problems, infection, or inflammation.  Problems with the intestine, a tumor, or irritable bowel syndrome.  Cancer of the female organs or bladder.  A severely tipped uterus.  A very tight opening or closed cervix.  Noncancerous tumors of the uterus (fibroids).  Pelvic inflammatory disease (PID).  Pelvic scarring (adhesions) from a previous surgery.  Ovarian cyst.  An intrauterine device (IUD) used for birth control. RISK FACTORS You may be at greater risk of dysmenorrhea if:  You are younger than age 13.  You started puberty early.  You have irregular or heavy bleeding.  You have never given birth.  You have a family history of this problem.  You are a smoker. SIGNS AND SYMPTOMS   Cramping or throbbing pain in your lower abdomen.  Headaches.  Lower back pain.  Nausea or vomiting.  Diarrhea.  Sweating or dizziness.  Loose stools. DIAGNOSIS  A diagnosis is based on your history, symptoms, physical exam, diagnostic tests, or procedures. Diagnostic tests or procedures may include:  Blood tests.  Ultrasonography.  An examination of the lining of the uterus (dilation and curettage, D&C).  An examination inside your abdomen or pelvis with a scope (laparoscopy).  X-rays.  CT scan.  MRI.  An examination inside the bladder with a scope (cystoscopy).  An examination inside the intestine or stomach with a scope (colonoscopy, gastroscopy). TREATMENT  Treatment depends on the cause of the dysmenorrhea. Treatment may include:  Pain medicine prescribed by your health care provider.  Birth  control pills or an IUD with progesterone hormone in it.  Hormone replacement therapy.  Nonsteroidal anti-inflammatory drugs (NSAIDs). These may help stop the production of prostaglandins.  Surgery to remove adhesions, endometriosis, ovarian cyst, or fibroids.  Removal of the uterus (hysterectomy).  Progesterone shots to stop the menstrual period.  Cutting the nerves on the sacrum that go to the female organs (presacral neurectomy).  Electric current to the sacral nerves (sacral nerve stimulation).  Antidepressant medicine.  Psychiatric therapy, counseling, or group therapy.  Exercise and physical therapy.  Meditation and yoga therapy.  Acupuncture. HOME CARE INSTRUCTIONS   Only take over-the-counter or prescription medicines as directed by your health care provider.  Place a heating pad or hot water bottle on your lower back or abdomen. Do not sleep with the heating pad.  Use aerobic exercises, walking, swimming, biking, and other exercises to help lessen the cramping.  Massage to the lower back or abdomen may help.  Stop smoking.  Avoid alcohol and caffeine. SEEK MEDICAL CARE IF:   Your pain does not get better with medicine.  You have pain with sexual intercourse.  Your pain increases and is not controlled with medicines.  You have abnormal vaginal bleeding with your period.  You develop nausea or vomiting with your period that is not controlled with medicine. SEEK IMMEDIATE MEDICAL  CARE IF:  You pass out.    This information is not intended to replace advice given to you by your health care provider. Make sure you discuss any questions you have with your health care provider.   Document Released: 10/04/2005 Document Revised: 06/06/2013 Document Reviewed: 03/22/2013 Elsevier Interactive Patient Education 2016 [employer] Asymptomatic Bacteriuria, Female Asymptomatic bacteriuria is the presence of a large number of bacteria in your urine without the usual  symptoms of burning or frequent urination. The following conditions increase the risk of asymptomatic bacteriuria:  Diabetes mellitus.  Advanced age.  Pregnancy in the first trimester.  Kidney stones.  Kidney transplants.  Leaky kidney tube valve in young children (reflux). Treatment for this condition is not needed in most people and can lead to other problems such as too much yeast and growth of resistant bacteria. However, some people, such as pregnant women, do need treatment to prevent kidney infection. Asymptomatic bacteriuria in pregnancy is also associated with fetal growth restriction, premature labor, and newborn death. HOME CARE INSTRUCTIONS Monitor your condition for any changes. The following actions may help to relieve any discomfort you are feeling:  Drink enough water and fluids to keep your urine clear or pale yellow. Go to the bathroom more often to keep your bladder empty.  Keep the area around your vagina and rectum clean. Wipe yourself from front to back after urinating. SEEK IMMEDIATE MEDICAL CARE IF:  You develop signs of an infection such as:  Burning with urination.  Frequency of voiding.  Back pain.  Fever.  You have blood in the urine.  You develop a fever. MAKE SURE YOU:  Understand these instructions.  Will watch your condition.  Will get help right away if you are not doing well or get worse.   This information is not intended to replace advice given to you by your health care provider. Make sure you discuss any questions you have with your health care provider.   Document Released: 10/04/2005 Document Revised: 10/25/2014 Document Reviewed: 03/26/2013 Elsevier Interactive Patient Education [employer] Elsevier Inc.

## 2016-02-09 NOTE — MAU Note (Signed)
Pt reports a lot of bleeding x 2 months, worsening. Cramping and back pain off/on. States her periods became very irregular after her BTL in December 2016

## 2016-02-09 NOTE — MAU Provider Note (Signed)
History     CSN: [id number]  Arrival date and time: 02/09/16 [id number]   First Provider Initiated Contact with Patient 02/09/16 2110      Chief Complaint  Patient presents with  . Abdominal Pain  . Vaginal Bleeding   HPI Ms. Christine Cooke is a 30 y.o. [id number] who presents to MAU today with complaint of vaginal bleeding and lower abdominal and lower back pain. The patient states that she had BTL in December 2015. She has had heavier period since then, but for the past 6-7 months her periods have been irregular. She continues to have heavy bleeding when she has periods. She also states associated cramping, although today pain is worse than usual. She rates her pain at 9/10 now. She took Ibuprofen 2 days ago without relief and has not taken anything more since then. She states that she started spotting last night and then bleeding heavier today. She has used 14 ultra thin, regular pads today, they were not soaked. She endorses weakness and occasional dizziness, but denies SOB or LOC.  OB History    Gravida Para Term Preterm AB TAB SAB Ectopic Multiple Living   5 4 4  1 1    0 4      Past Medical History  Diagnosis Date  . Ovarian cyst   . GERD (gastroesophageal reflux disease)     with pregnancy  . Anemia   . Infection     UTI    Past Surgical History  Procedure Laterality Date  . Cesarean section    . Induced abortion    . Cesarean section with bilateral tubal ligation  09/27/2014    Procedure: CESAREAN SECTION WITH BILATERAL TUBAL LIGATION;  Surgeon: Catalina AntiguaPeggy Constant, MD;  Location: [facility];  Service: Obstetrics;;  . Tubal ligation      Family History  Problem Relation Age of Onset  . Hypertension Mother   . Sickle cell anemia Sister   . Asthma Sister   . Diabetes Maternal Aunt   . Hypertension Maternal Aunt   . Stroke Maternal Grandmother   . Hypertension Maternal Grandmother   . Hyperlipidemia Maternal Grandmother   . Heart disease Maternal Grandmother   . Diabetes  Maternal Grandmother   . Asthma Son     Social History  Substance Use Topics  . Smoking status: Former Smoker -- 0.25 packs/day for 2 years    Types: Cigarettes    Quit date: 09/25/2009  . Smokeless tobacco: Never Used  . Alcohol Use: Yes     Comment: weekends    Allergies: No Known Allergies  Prescriptions prior to admission  Medication Sig Dispense Refill Last Dose  . ibuprofen (ADVIL,MOTRIN) 200 MG tablet Take 800 mg by mouth every 6 (six) hours as needed for headache, moderate pain or cramping.   Past Week at Unknown time  . docusate sodium (COLACE) 100 MG capsule Take 1 capsule (100 mg total) by mouth 2 (two) times daily as needed. (Patient not taking: Reported on 02/09/2016) 30 capsule 0   . omeprazole (PRILOSEC) 20 MG capsule Take 1 capsule (20 mg total) by mouth daily. (Patient not taking: Reported on 02/09/2016) 14 capsule 0     Review of Systems  Constitutional: Negative for fever and malaise/fatigue.  Gastrointestinal: Positive for abdominal pain. Negative for nausea, vomiting, diarrhea and constipation.  Genitourinary: Negative for dysuria, urgency and frequency.       + vaginal bleeding  Neurological: Positive for dizziness and weakness. Negative for loss of consciousness.  Physical Exam   Blood pressure 130/83, pulse 80, temperature 98.1 F (36.7 C), temperature source Oral, resp. rate 16, height 5' 6.5" (1.689 m), weight 197 lb (89.359 kg), SpO2 100 %, not currently breastfeeding.  Physical Exam  Nursing note and vitals reviewed. Constitutional: She is oriented to person, place, and time. She appears well-developed and well-nourished. No distress.  HENT:  Head: Normocephalic and atraumatic.  Cardiovascular: Normal rate.   Respiratory: Effort normal.  GI: Soft. She exhibits no distension and no mass. There is tenderness (mild diffuse tenderness to palpation). There is no rebound, no guarding and no CVA tenderness.  Genitourinary: Uterus is tender (mild). Uterus  is not enlarged. Cervix exhibits no motion tenderness, no discharge and no friability. Right adnexum displays no mass and no tenderness. Left adnexum displays no mass and no tenderness. There is bleeding (small blood in the vaginal vault) in the vagina. No vaginal discharge found.  Neurological: She is alert and oriented to person, place, and time.  Skin: Skin is warm and dry. No erythema.  Psychiatric: She has a normal mood and affect.    Results for orders placed or performed during the hospital encounter of 02/09/16 (from the past 24 hour(s))  Urinalysis, Routine w reflex microscopic (not at [facility])     Status: Abnormal   Collection Time: 02/09/16  8:00 PM  Result Value Ref Range   Color, Urine RED (A) YELLOW   APPearance TURBID (A) CLEAR   Specific Gravity, Urine >1.030 (H) 1.005 - 1.030   pH 6.0 5.0 - 8.0   Glucose, UA NEGATIVE NEGATIVE mg/dL   Hgb urine dipstick LARGE (A) NEGATIVE   Bilirubin Urine SMALL (A) NEGATIVE   Ketones, ur 15 (A) NEGATIVE mg/dL   Protein, ur >161 (A) NEGATIVE mg/dL   Nitrite POSITIVE (A) NEGATIVE   Leukocytes, UA TRACE (A) NEGATIVE  Urine microscopic-add on     Status: Abnormal   Collection Time: 02/09/16  8:00 PM  Result Value Ref Range   Squamous Epithelial / LPF NONE SEEN NONE SEEN   WBC, UA 0-5 0 - 5 WBC/hpf   RBC / HPF TOO NUMEROUS TO COUNT 0 - 5 RBC/hpf   Bacteria, UA MANY (A) NONE SEEN  CBC with Differential/Platelet     Status: Abnormal   Collection Time: 02/09/16  9:05 PM  Result Value Ref Range   WBC 10.8 (H) 4.0 - 10.5 K/uL   RBC 3.76 (L) 3.87 - 5.11 MIL/uL   Hemoglobin 10.8 (L) 12.0 - 15.0 g/dL   HCT 09.6 (L) 04.5 - 40.9 %   MCV 87.2 78.0 - 100.0 fL   MCH 28.7 26.0 - 34.0 pg   MCHC 32.9 30.0 - 36.0 g/dL   RDW 81.1 91.4 - 78.2 %   Platelets 227 150 - 400 K/uL   Neutrophils Relative % 63 %   Neutro Abs 6.8 1.7 - 7.7 K/uL   Lymphocytes Relative 31 %   Lymphs Abs 3.4 0.7 - 4.0 K/uL   Monocytes Relative 5 %   Monocytes Absolute 0.5 0.1  - 1.0 K/uL   Eosinophils Relative 1 %   Eosinophils Absolute 0.1 0.0 - 0.7 K/uL   Basophils Relative 0 %   Basophils Absolute 0.0 0.0 - 0.1 K/uL  Wet prep, genital     Status: Abnormal   Collection Time: 02/09/16  9:32 PM  Result Value Ref Range   Yeast Wet Prep HPF POC NONE SEEN NONE SEEN   Trich, Wet Prep NONE SEEN NONE SEEN  Clue Cells Wet Prep HPF POC NONE SEEN NONE SEEN   WBC, Wet Prep HPF POC FEW (A) NONE SEEN   Sperm NONE SEEN     MAU Course  Procedures None  MDM UPT - negative UA, CBC, wet prep and GC/Chlamydia today Urine culture today 60 mg IM Toradol given for pain - patient reports some improvement in pain Patient is stable for discharge and completion of AUB work-up as outpatient.  Assessment and Plan  A: UTI Abnormal Uterine Bleeding Dysmenorrhea   P: Discharge home Rx for Cipro and Percocet given  Bleeding precautions discussed Outpatient [place] ordered to be scheduled prior to [facility] follow-up. They will call with appointment Patient advised to follow-up with [facility] in 2-3 weeks. They will call with her with appointment date/time Patient may return to MAU as needed or if her condition were to change or worsen   Marny Lowenstein, PA-C  02/09/2016, 10:14 PM

## 2016-02-10 LAB — GC/CHLAMYDIA PROBE AMP (~~LOC~~) NOT AT ARMC
CHLAMYDIA, DNA PROBE: NEGATIVE
NEISSERIA GONORRHEA: NEGATIVE

## 2016-02-11 LAB — URINE CULTURE

## 2016-02-16 ENCOUNTER — Ambulatory Visit (HOSPITAL_COMMUNITY): Payer: Medicaid Other | Attending: Medical

## 2016-10-04 ENCOUNTER — Encounter (HOSPITAL_BASED_OUTPATIENT_CLINIC_OR_DEPARTMENT_OTHER): Payer: Self-pay | Admitting: *Deleted

## 2016-10-04 ENCOUNTER — Emergency Department (HOSPITAL_BASED_OUTPATIENT_CLINIC_OR_DEPARTMENT_OTHER)
Admission: EM | Admit: 2016-10-04 | Discharge: 2016-10-04 | Disposition: A | Discharge status: Home / Self Care | Payer: Medicaid Other | Attending: Emergency Medicine | Admitting: Emergency Medicine

## 2016-10-04 DIAGNOSIS — M545 Low back pain: Secondary | ICD-10-CM | POA: Insufficient documentation

## 2016-10-04 DIAGNOSIS — Z791 Long term (current) use of non-steroidal anti-inflammatories (NSAID): Secondary | ICD-10-CM | POA: Insufficient documentation

## 2016-10-04 DIAGNOSIS — R1030 Lower abdominal pain, unspecified: Secondary | ICD-10-CM

## 2016-10-04 DIAGNOSIS — Z79899 Other long term (current) drug therapy: Secondary | ICD-10-CM | POA: Insufficient documentation

## 2016-10-04 LAB — URINALYSIS, ROUTINE W REFLEX MICROSCOPIC
BILIRUBIN URINE: NEGATIVE
GLUCOSE, UA: NEGATIVE mg/dL
KETONES UR: NEGATIVE mg/dL
NITRITE: NEGATIVE
PH: 6.5 (ref 5.0–8.0)
Protein, ur: NEGATIVE mg/dL
Specific Gravity, Urine: 1.022 (ref 1.005–1.030)

## 2016-10-04 LAB — WET PREP, GENITAL
CLUE CELLS WET PREP: NONE SEEN
SPERM: NONE SEEN
TRICH WET PREP: NONE SEEN
YEAST WET PREP: NONE SEEN

## 2016-10-04 LAB — URINALYSIS, MICROSCOPIC (REFLEX)

## 2016-10-04 LAB — PREGNANCY, URINE: Preg Test, Ur: NEGATIVE

## 2016-10-04 MED ORDER — ACETAMINOPHEN 500 MG PO TABS
1000.0000 mg | ORAL_TABLET | Freq: Once | ORAL | Status: AC
  Administered 2016-10-04: 1000 mg via ORAL
  Filled 2016-10-04: qty 2

## 2016-10-04 MED ORDER — CEFTRIAXONE SODIUM 250 MG IJ SOLR
250.0000 mg | Freq: Once | INTRAMUSCULAR | Status: AC
  Administered 2016-10-04: 250 mg via INTRAMUSCULAR
  Filled 2016-10-04: qty 250

## 2016-10-04 MED ORDER — IBUPROFEN 800 MG PO TABS
800.0000 mg | ORAL_TABLET | Freq: Once | ORAL | Status: AC
  Administered 2016-10-04: 800 mg via ORAL
  Filled 2016-10-04: qty 1

## 2016-10-04 MED ORDER — LIDOCAINE HCL (PF) 1 % IJ SOLN
INTRAMUSCULAR | Status: AC
  Administered 2016-10-04: 5 mL
  Filled 2016-10-04: qty 5

## 2016-10-04 MED ORDER — AZITHROMYCIN 250 MG PO TABS
1000.0000 mg | ORAL_TABLET | Freq: Once | ORAL | Status: AC
  Administered 2016-10-04: 1000 mg via ORAL
  Filled 2016-10-04: qty 4

## 2016-10-04 NOTE — ED Triage Notes (Signed)
Pain in lower abdomen and back x 1 week.

## 2016-10-04 NOTE — ED Provider Notes (Signed)
[facility] Provider Note   CSN: [id number] Arrival date & time: 10/04/16  1733  By signing my name below, I, Christine Cooke, attest that this documentation has been prepared under the direction and in the presence of No att. providers found. Electronically signed, Christine Cooke, ED Scribe. 10/04/16. 6:46 PM.  History   Chief Complaint Chief Complaint  Patient presents with  . Abdominal Pain  . Back Pain    HPI HPI Comments: Christine Cooke is a 30 y.o. female, with hx of GERD, ovarian cyst, who presents to the Emergency Department complaining of constant, mild to moderate lower abdominal pain with associated lower back pain that started one week ago. Pt reports fever and chills earlier in the week when her symptoms started but states that the fever and chills have subsided. No recent injuries. No vaginal bleeding or discharged. No urinary symptoms.  The history is provided by the patient. No language interpreter was used.    Past Medical History:  Diagnosis Date  . Anemia   . GERD (gastroesophageal reflux disease)    with pregnancy  . Infection    UTI  . Ovarian cyst     Patient Active Problem List   Diagnosis Date Noted  . Delivered by cesarean section 09/29/2014  . Abdominal pain in pregnancy, antepartum 08/25/2014  . Supervision of normal pregnancy 05/30/2014  . History of cesarean delivery, currently pregnant 05/30/2014  . Marijuana use 05/30/2014  . Rubella non-immune status, antepartum 05/27/2014    Past Surgical History:  Procedure Laterality Date  . CESAREAN SECTION    . CESAREAN SECTION WITH BILATERAL TUBAL LIGATION  09/27/2014   Procedure: CESAREAN SECTION WITH BILATERAL TUBAL LIGATION;  Surgeon: Catalina Antigua, MD;  Location: [facility];  Service: Obstetrics;;  . INDUCED ABORTION    . TUBAL LIGATION      OB History    Gravida Para Term Preterm AB Living   5 4 4   1 4    SAB TAB Ectopic Multiple Live Births     1   0 4       Home  Medications    Prior to Admission medications   Medication Sig Start Date End Date Taking? Authorizing Provider  ciprofloxacin (CIPRO) 250 MG tablet Take 1 tablet (250 mg total) by mouth every 12 (twelve) hours. 02/09/16   Marny Lowenstein, PA-C  ibuprofen (ADVIL,MOTRIN) 200 MG tablet Take 800 mg by mouth every 6 (six) hours as needed for headache, moderate pain or cramping.    Historical Provider, MD  oxyCODONE-acetaminophen (PERCOCET/ROXICET) 5-325 MG tablet Take 1 tablet by mouth every 6 (six) hours as needed for severe pain. 02/09/16   Marny Lowenstein, PA-C    Family History Family History  Problem Relation Age of Onset  . Hypertension Mother   . Sickle cell anemia Sister   . Asthma Sister   . Diabetes Maternal Aunt   . Hypertension Maternal Aunt   . Stroke Maternal Grandmother   . Hypertension Maternal Grandmother   . Hyperlipidemia Maternal Grandmother   . Heart disease Maternal Grandmother   . Diabetes Maternal Grandmother   . Asthma Son     Social History Social History  Substance Use Topics  . Smoking status: Former Smoker    Packs/day: 0.25    Years: 2.00    Types: Cigarettes    Quit date: 09/25/2009  . Smokeless tobacco: Never Used  . Alcohol use Yes     Comment: weekends     Allergies  Patient has no known allergies.   Review of Systems Review of Systems  Constitutional: Negative for chills and fever.  HENT: Negative for congestion and rhinorrhea.   Eyes: Negative for redness and visual disturbance.  Respiratory: Negative for shortness of breath and wheezing.   Cardiovascular: Negative for chest pain and palpitations.  Gastrointestinal: Positive for abdominal pain (lower). Negative for nausea and vomiting.  Genitourinary: Negative for dysuria and urgency.  Musculoskeletal: Positive for back pain (lower). Negative for arthralgias and myalgias.  Skin: Negative for pallor and wound.  Neurological: Negative for dizziness and headaches.    Physical  Exam Updated Vital Signs BP 144/89 (BP Location: Right Arm)   Pulse 93   Temp 98.4 F (36.9 C) (Oral)   Resp 16   Ht 5\' 7"  (1.702 m)   Wt 197 lb (89.4 kg)   LMP 09/27/2016   SpO2 96%   BMI 30.85 kg/m   Physical Exam  Constitutional: She is oriented to person, place, and time. She appears well-developed and well-nourished. No distress.  HENT:  Head: Normocephalic and atraumatic.  Eyes: EOM are normal. Pupils are equal, round, and reactive to light.  Neck: Normal range of motion. Neck supple.  Cardiovascular: Normal rate and regular rhythm.  Exam reveals no gallop and no friction rub.   No murmur heard. Pulmonary/Chest: Effort normal. She has no wheezes. She has no rales.  Abdominal: Soft. She exhibits no distension. There is tenderness.  Diffuse lower abdominal tenderness that is worse in suprapubic region  Musculoskeletal: She exhibits no edema or tenderness.  Neurological: She is alert and oriented to person, place, and time.  Skin: Skin is warm and dry. She is not diaphoretic.  Psychiatric: She has a normal mood and affect. Her behavior is normal.  Nursing note and vitals reviewed.  ED Treatments / Results  DIAGNOSTIC STUDIES: Oxygen Saturation is 100% on RA, normal by my interpretation.  COORDINATION OF CARE: 6:34 PM-Discussed treatment plan with pt at bedside and pt agreed to plan.   Labs (all labs ordered are listed, but only abnormal results are displayed) Labs Reviewed  WET PREP, GENITAL - Abnormal; Notable for the following:       Result Value   WBC, Wet Prep HPF POC MODERATE (*)    All other components within normal limits  URINALYSIS, ROUTINE W REFLEX MICROSCOPIC - Abnormal; Notable for the following:    APPearance CLOUDY (*)    Hgb urine dipstick SMALL (*)    Leukocytes, UA SMALL (*)    All other components within normal limits  URINALYSIS, MICROSCOPIC (REFLEX) - Abnormal; Notable for the following:    Bacteria, UA FEW (*)    Squamous Epithelial / LPF  6-30 (*)    All other components within normal limits  PREGNANCY, URINE  RPR  HIV ANTIBODY (ROUTINE TESTING)  GC/CHLAMYDIA PROBE AMP ([place]) NOT AT [facility]    EKG  EKG Interpretation None      Radiology No results found.  Procedures Procedures (including critical care time)  Medications Ordered in ED Medications  cefTRIAXone (ROCEPHIN) injection 250 mg (250 mg Intramuscular Given 10/04/16 1938)  azithromycin (ZITHROMAX) tablet 1,000 mg (1,000 mg Oral Given 10/04/16 1941)  acetaminophen (TYLENOL) tablet 1,000 mg (1,000 mg Oral Given 10/04/16 1941)  ibuprofen (ADVIL,MOTRIN) tablet 800 mg (800 mg Oral Given 10/04/16 1941)  lidocaine (PF) (XYLOCAINE) 1 % injection (5 mLs  Given 10/04/16 1940)     Initial Impression / Assessment and Plan / ED Course  I have  reviewed the triage vital signs and the nursing notes.  Pertinent labs & imaging results that were available during my care of the patient were reviewed by me and considered in my medical decision making (see chart for details).  Clinical Course     30 yo F With a chief complaint of lower abdominal pain. Going on for the past week or so. On exam pain is significantly low. Mild cervical motion tenderness. Will treat for cervicitis. As patient was being discharged she noted that her symptoms have persisted and she had a C-section. With that history and symptoms seem more likely to be related to fibroids or adhesions. Suggested that she follow-up with her OB/GYN.  11:36 PM:  I have discussed the diagnosis/risks/treatment options with the patient and family and believe the pt to be eligible for discharge home to follow-up with OB/GYN. We also discussed returning to the ED immediately if new or worsening sx occur. We discussed the sx which are most concerning (e.g., sudden worsening pain, fever, inability to tolerate by mouth) that necessitate immediate return. Medications administered to the patient during their visit and any new  prescriptions provided to the patient are listed below.  Medications given during this visit Medications  cefTRIAXone (ROCEPHIN) injection 250 mg (250 mg Intramuscular Given 10/04/16 1938)  azithromycin (ZITHROMAX) tablet 1,000 mg (1,000 mg Oral Given 10/04/16 1941)  acetaminophen (TYLENOL) tablet 1,000 mg (1,000 mg Oral Given 10/04/16 1941)  ibuprofen (ADVIL,MOTRIN) tablet 800 mg (800 mg Oral Given 10/04/16 1941)  lidocaine (PF) (XYLOCAINE) 1 % injection (5 mLs  Given 10/04/16 1940)     The patient appears reasonably screen and/or stabilized for discharge and I doubt any other medical condition or other [facility] requiring further screening, evaluation, or treatment in the ED at this time prior to discharge.    Final Clinical Impressions(s) / ED Diagnoses   Final diagnoses:  Lower abdominal pain    New Prescriptions Discharge Medication List as of 10/04/2016  7:54 PM    I personally performed the services described in this documentation, which was scribed in my presence. The recorded information has been reviewed and is accurate.     Melene Planan Adryanna Friedt, DO 10/04/16 2336

## 2016-10-04 NOTE — Discharge Instructions (Signed)
Since this started after your C-section and may be beneficial to see your OB/GYN and discuss your current symptoms.  Take 4 over the counter ibuprofen tablets 3 times a day or 2 over-the-counter naproxen tablets twice a day for pain. Also take tylenol 1000mg (2 extra strength) four times a day.

## 2016-10-05 LAB — GC/CHLAMYDIA PROBE AMP (~~LOC~~) NOT AT ARMC
CHLAMYDIA, DNA PROBE: NEGATIVE
NEISSERIA GONORRHEA: NEGATIVE

## 2016-10-05 LAB — HIV ANTIBODY (ROUTINE TESTING W REFLEX): HIV Screen 4th Generation wRfx: NONREACTIVE

## 2016-10-05 LAB — RPR: RPR Ser Ql: NONREACTIVE

## 2017-12-11 ENCOUNTER — Other Ambulatory Visit: Payer: Self-pay

## 2017-12-11 ENCOUNTER — Emergency Department (HOSPITAL_BASED_OUTPATIENT_CLINIC_OR_DEPARTMENT_OTHER): Payer: No Typology Code available for payment source

## 2017-12-11 ENCOUNTER — Emergency Department (HOSPITAL_BASED_OUTPATIENT_CLINIC_OR_DEPARTMENT_OTHER)
Admission: EM | Admit: 2017-12-11 | Discharge: 2017-12-11 | Disposition: A | Discharge status: Home / Self Care | Payer: No Typology Code available for payment source | Attending: Emergency Medicine | Admitting: Emergency Medicine

## 2017-12-11 ENCOUNTER — Encounter (HOSPITAL_BASED_OUTPATIENT_CLINIC_OR_DEPARTMENT_OTHER): Payer: Self-pay | Admitting: Emergency Medicine

## 2017-12-11 DIAGNOSIS — Y999 Unspecified external cause status: Secondary | ICD-10-CM | POA: Insufficient documentation

## 2017-12-11 DIAGNOSIS — Z79899 Other long term (current) drug therapy: Secondary | ICD-10-CM | POA: Insufficient documentation

## 2017-12-11 DIAGNOSIS — Z87891 Personal history of nicotine dependence: Secondary | ICD-10-CM | POA: Insufficient documentation

## 2017-12-11 DIAGNOSIS — S199XXA Unspecified injury of neck, initial encounter: Secondary | ICD-10-CM | POA: Diagnosis present

## 2017-12-11 DIAGNOSIS — S39012A Strain of muscle, fascia and tendon of lower back, initial encounter: Secondary | ICD-10-CM | POA: Insufficient documentation

## 2017-12-11 DIAGNOSIS — Y92410 Unspecified street and highway as the place of occurrence of the external cause: Secondary | ICD-10-CM | POA: Diagnosis not present

## 2017-12-11 DIAGNOSIS — R079 Chest pain, unspecified: Secondary | ICD-10-CM | POA: Diagnosis not present

## 2017-12-11 DIAGNOSIS — Y9389 Activity, other specified: Secondary | ICD-10-CM | POA: Insufficient documentation

## 2017-12-11 DIAGNOSIS — Z3202 Encounter for pregnancy test, result negative: Secondary | ICD-10-CM | POA: Diagnosis not present

## 2017-12-11 DIAGNOSIS — S161XXA Strain of muscle, fascia and tendon at neck level, initial encounter: Secondary | ICD-10-CM

## 2017-12-11 LAB — PREGNANCY, URINE: Preg Test, Ur: NEGATIVE
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
## 2017-12-11 MED ORDER — NAPROXEN 250 MG PO TABS
500.0000 mg | ORAL_TABLET | Freq: Once | ORAL | Status: AC
  Administered 2017-12-11: 500 mg via ORAL
  Filled 2017-12-11: qty 2

## 2017-12-11 MED ORDER — NAPROXEN 375 MG PO TABS: 375.0000 mg | ORAL_TABLET | Freq: Two times a day (BID) | ORAL | 0 refills | Status: DC

## 2017-12-11 MED ORDER — METHOCARBAMOL 500 MG PO TABS: 500.0000 mg | ORAL_TABLET | Freq: Two times a day (BID) | ORAL | 0 refills | Status: DC

## 2017-12-11 NOTE — ED Notes (Signed)
Pt signed for DC paperwork after reviewing with RN. Signature pad broken.

## 2017-12-11 NOTE — Discharge Instructions (Signed)
X-rays were normal.  There is likely muscular skeletal pain. Please take the Naproxen as prescribed for pain. Do not take any additional NSAIDs including Motrin, Aleve, Ibuprofen, Advil. Have been given your first dose in the ED. Please the the robaxin for muscle relaxation. This medication will make you drowsy so avoid situation that could place you in danger.  Warm compresses to the affected area.  Warm soaks and Epsom salt.  Follow-up with your primary care doctor return to the ED with any worsening symptoms.
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
## 2022-02-17 ENCOUNTER — Ambulatory Visit (INDEPENDENT_AMBULATORY_CARE_PROVIDER_SITE_OTHER): Payer: BC Managed Care – PPO | Admitting: Family Medicine

## 2022-02-17 ENCOUNTER — Encounter: Payer: Self-pay | Admitting: Family Medicine

## 2022-02-17 VITALS — BP 118/76 | HR 85 | Temp 97.9°F | Ht 67.5 in | Wt 203.0 lb

## 2022-02-17 DIAGNOSIS — L659 Nonscarring hair loss, unspecified: Secondary | ICD-10-CM

## 2022-02-17 DIAGNOSIS — Z124 Encounter for screening for malignant neoplasm of cervix: Secondary | ICD-10-CM

## 2022-02-17 DIAGNOSIS — F321 Major depressive disorder, single episode, moderate: Secondary | ICD-10-CM | POA: Diagnosis not present

## 2022-02-17 DIAGNOSIS — F411 Generalized anxiety disorder: Secondary | ICD-10-CM | POA: Diagnosis not present

## 2022-02-17 LAB — CBC
HCT: 30.9 % — ABNORMAL LOW (ref 36.0–46.0)
Hemoglobin: 9.6 g/dL — ABNORMAL LOW (ref 12.0–15.0)
MCHC: 31.1 g/dL (ref 30.0–36.0)
MCV: 79.1 fl (ref 78.0–100.0)
Platelets: 240 10*3/uL (ref 150.0–400.0)
RBC: 3.9 Mil/uL (ref 3.87–5.11)
RDW: 19.8 % — ABNORMAL HIGH (ref 11.5–15.5)
WBC: 8.6 10*3/uL (ref 4.0–10.5)

## 2022-02-17 LAB — COMPREHENSIVE METABOLIC PANEL
ALT: 10 U/L (ref 0–35)
AST: 14 U/L (ref 0–37)
Albumin: 4.1 g/dL (ref 3.5–5.2)
Alkaline Phosphatase: 60 U/L (ref 39–117)
BUN: 7 mg/dL (ref 6–23)
CO2: 28 mEq/L (ref 19–32)
Calcium: 8.8 mg/dL (ref 8.4–10.5)
Chloride: 106 mEq/L (ref 96–112)
Creatinine, Ser: 0.8 mg/dL (ref 0.40–1.20)
GFR: 95.41 mL/min (ref >60.00)
Glucose, Bld: 82 mg/dL (ref 70–99)
Potassium: 3.8 mEq/L (ref 3.5–5.1)
Sodium: 138 mEq/L (ref 135–145)
Total Bilirubin: 0.7 mg/dL (ref 0.2–1.2)
Total Protein: 6.9 g/dL (ref 6.0–8.3)

## 2022-02-17 LAB — IBC + FERRITIN
Ferritin: 3.9 ng/mL — ABNORMAL LOW (ref 10.0–291.0)
Iron: 32 ug/dL — ABNORMAL LOW (ref 42–145)
Saturation Ratios: 7.4 % — ABNORMAL LOW (ref 20.0–50.0)
TIBC: 434 ug/dL (ref 250.0–450.0)
Transferrin: 310 mg/dL (ref 212.0–360.0)

## 2022-02-17 LAB — TSH: TSH: 1.73 u[IU]/mL (ref 0.35–5.50)

## 2022-02-17 MED ORDER — ESCITALOPRAM OXALATE 10 MG PO TABS
10.0000 mg | ORAL_TABLET | Freq: Every day | ORAL | 2 refills | Status: DC
Start: 2022-02-17 — End: 2022-12-20
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
## 2022-02-17 NOTE — Patient Instructions (Addendum)
Give us 2-3 business days to get the results of your labs back.  ? ?If you do not hear anything about your referral in the next 1-2 weeks, call our office and ask for an update. ? ?Try to get around 100-125 g of protein in daily.  ? ?Consider biotin daily.  ? ?Take 1/2 tab daily of your Lexapro for the first 2 weeks.  ? ?Please consider counseling. Contact [phone] to schedule an appointment or inquire about cost/insurance coverage. ? ?[facility] located at [address], [address], [place], [place].  Phone number = [phone].  Dr. Regan Lemmingnoriode Edeh - Adult Psychiatry.  ?  ?[facility] located at [address], [place], [place]. Phone number = [phone]. ?  ?[facility] located at [address], [place], [place].  Phone number = [phone]. ?  ?[facility] located at [address], [place], [place].  Phone number = [phone]. ? ?Let us know if you need anything. ? ?EXERCISES  ?RANGE OF MOTION (ROM) AND STRETCHING EXERCISES - Low Back Pain ?Most people with lower back pain will find that their symptoms get worse with excessive bending forward (flexion) or arching at the lower back (extension). The exercises that will help resolve your symptoms will focus on the opposite motion.  ?If you have pain, numbness or tingling which travels down into your buttocks, leg or foot, the goal of the therapy is for these symptoms to move closer to your back and eventually resolve. Sometimes, these leg symptoms will get better, but your lower back pain may worsen. This is often an indication of progress in your rehabilitation. Be very alert to any changes in your symptoms and the activities in which you participated in the 24 hours prior to the change. Sharing this information with your caregiver will allow him or her to most efficiently treat your condition. ?These exercises may help you when beginning to rehabilitate your injury. Your symptoms may  resolve with or without further involvement from your physician, physical therapist or athletic trainer. While completing these exercises, remember:  ?Restoring tissue flexibility helps normal motion to return to the joints. This allows healthier, less painful movement and activity. ?An effective stretch should be held for at least 30 seconds. ?A stretch should never be painful. You should only feel a gentle lengthening or release in the stretched tissue. ?FLEXION RANGE OF MOTION AND STRETCHING EXERCISES: ? ?STRETCH - Flexion, Single Knee to Chest  ?Lie on a firm bed or floor with both legs extended in front of you. ?Keeping one leg in contact with the floor, bring your opposite knee to your chest. Hold your leg in place by either grabbing behind your thigh or at your knee. ?Pull until you feel a gentle stretch in your low back. Hold 30 seconds. ?Slowly release your grasp and repeat the exercise with the opposite side. ?Repeat 2 times. Complete this exercise 3 times per week.  ? ?STRETCH - Flexion, Double Knee to Chest ?Lie on a firm bed or floor with both legs extended in front of you. ?Keeping one leg in contact with the floor, bring your opposite knee to your chest. ?Tense your stomach muscles to support your back and then lift your other knee to your chest. Hold your legs in place by either grabbing behind your thighs or at your knees. ?Pull both knees toward your chest until you feel a gentle stretch in your low back. Hold 30 seconds. ?Tense your stomach muscles and slowly  return one leg at a time to the floor. ?Repeat 2 times. Complete this exercise 3 times per week.  ? ?STRETCH - Low Trunk Rotation ?Lie on a firm bed or floor. Keeping your legs in front of you, bend your knees so they are both pointed toward the ceiling and your feet are flat on the floor. ?Extend your arms out to the side. This will stabilize your upper body by keeping your shoulders in contact with the floor. ?Gently and slowly drop both  knees together to one side until you feel a gentle stretch in your low back. Hold for 30 seconds. ?Tense your stomach muscles to support your lower back as you bring your knees back to the starting position. Repeat the exercise to the other side. ?Repeat 2 times. Complete this exercise at least 3 times per week. ?  ?EXTENSION RANGE OF MOTION AND FLEXIBILITY EXERCISES: ? ?STRETCH - Extension, Prone on Elbows  ?Lie on your stomach on the floor, a bed will be too soft. Place your palms about shoulder width apart and at the height of your head. ?Place your elbows under your shoulders. If this is too painful, stack pillows under your chest. ?Allow your body to relax so that your hips drop lower and make contact more completely with the floor. ?Hold this position for 30 seconds. ?Slowly return to lying flat on the floor. ?Repeat 2 times. Complete this exercise 3 times per week.  ? ?RANGE OF MOTION - Extension, Prone Press Ups ?Lie on your stomach on the floor, a bed will be too soft. Place your palms about shoulder width apart and at the height of your head. ?Keeping your back as relaxed as possible, slowly straighten your elbows while keeping your hips on the floor. You may adjust the placement of your hands to maximize your comfort. As you gain motion, your hands will come more underneath your shoulders. ?Hold this position 30 seconds. ?Slowly return to lying flat on the floor. ?Repeat 2 times. Complete this exercise 3 times per week.  ? ?RANGE OF MOTION- Quadruped, Neutral Spine  ?Assume a hands and knees position on a firm surface. Keep your hands under your shoulders and your knees under your hips. You may place padding under your knees for comfort. ?Drop your head and point your tailbone toward the ground below you. This will round out your lower back like an angry cat. Hold this position for 30 seconds. ?Slowly lift your head and release your tail bone so that your back sags into a large arch, like an old  horse. ?Hold this position for 30 seconds. ?Repeat this until you feel limber in your low back. ?Now, find your "sweet spot." This will be the most comfortable position somewhere between the two previous positions. This is your neutral spine. Once you have found this position, tense your stomach muscles to support your low back. ?Hold this position for 30 seconds. ?Repeat 2 times. Complete this exercise 3 times per week.  ? ?STRENGTHENING EXERCISES - Low Back Sprain ?These exercises may help you when beginning to rehabilitate your injury. These exercises should be done near your "sweet spot." This is the neutral, low-back arch, somewhere between fully rounded and fully arched, that is your least painful position. When performed in this safe range of motion, these exercises can be used for people who have either a flexion or extension based injury. These exercises may resolve your symptoms with or without further involvement from your physician, physical therapist or athletic  trainer. While completing these exercises, remember:  ?Muscles can gain both the endurance and the strength needed for everyday activities through controlled exercises. ?Complete these exercises as instructed by your physician, physical therapist or athletic trainer. Increase the resistance and repetitions only as guided. ?You may experience muscle soreness or fatigue, but the pain or discomfort you are trying to eliminate should never worsen during these exercises. If this pain does worsen, stop and make certain you are following the directions exactly. If the pain is still present after adjustments, discontinue the exercise until you can discuss the trouble with your caregiver. ? ?STRENGTHENING - Deep Abdominals, Pelvic Tilt  ?Lie on a firm bed or floor. Keeping your legs in front of you, bend your knees so they are both pointed toward the ceiling and your feet are flat on the floor. ?Tense your lower abdominal muscles to press your low back  into the floor. This motion will rotate your pelvis so that your tail bone is scooping upwards rather than pointing at your feet or into the floor. ?With a gentle tension and even breathing, hold this position for

## 2022-02-23 ENCOUNTER — Telehealth: Payer: Self-pay | Admitting: Family Medicine

## 2022-02-23 MED ORDER — ONDANSETRON 4 MG PO TBDP: 4.0000 mg | ORAL_TABLET | Freq: Three times a day (TID) | ORAL | 0 refills | Status: DC | PRN

## 2022-02-23 NOTE — Telephone Encounter (Signed)
Called informed of PCP instructions. ?She agreed to do ?

## 2022-02-23 NOTE — Telephone Encounter (Signed)
Pt called stating the lexapro she is taking is making her nauseous and was wondering if there was any anti- nausea meds she could take to help with that. Pt states that other than the nausea, there are no other symptoms she is having. Please Advise.  ?

## 2022-02-23 NOTE — Telephone Encounter (Signed)
I sent in some anti-nausea medication, but if it persists after prescription has run out, Lexapro may not be the medication for her.  ?

## 2022-03-17 ENCOUNTER — Encounter: Payer: Self-pay | Admitting: Family Medicine

## 2022-03-17 ENCOUNTER — Ambulatory Visit (INDEPENDENT_AMBULATORY_CARE_PROVIDER_SITE_OTHER): Payer: BC Managed Care – PPO | Admitting: Family Medicine

## 2022-03-17 VITALS — BP 110/80 | HR 83 | Temp 97.9°F | Ht 67.5 in | Wt 196.5 lb

## 2022-03-17 DIAGNOSIS — F321 Major depressive disorder, single episode, moderate: Secondary | ICD-10-CM

## 2022-03-17 DIAGNOSIS — D509 Iron deficiency anemia, unspecified: Secondary | ICD-10-CM | POA: Diagnosis not present

## 2022-03-17 DIAGNOSIS — F411 Generalized anxiety disorder: Secondary | ICD-10-CM

## 2022-03-17 LAB — CBC
HCT: 36.4 % (ref 36.0–46.0)
Hemoglobin: 11.6 g/dL — ABNORMAL LOW (ref 12.0–15.0)
MCHC: 31.9 g/dL (ref 30.0–36.0)
MCV: 83.4 fl (ref 78.0–100.0)
Platelets: 201 10*3/uL (ref 150.0–400.0)
RBC: 4.36 Mil/uL (ref 3.87–5.11)
RDW: 24 % — ABNORMAL HIGH (ref 11.5–15.5)
WBC: 8.2 10*3/uL (ref 4.0–10.5)

## 2022-03-17 LAB — IBC + FERRITIN
Ferritin: 12.7 ng/mL (ref 10.0–291.0)
Iron: 43 ug/dL (ref 42–145)
Saturation Ratios: 10.9 % — ABNORMAL LOW (ref 20.0–50.0)
TIBC: 396.2 ug/dL (ref 250.0–450.0)
Transferrin: 283 mg/dL (ref 212.0–360.0)

## 2022-03-17 MED ORDER — ONDANSETRON 4 MG PO TBDP: 4.0000 mg | ORAL_TABLET | Freq: Three times a day (TID) | ORAL | 0 refills | Status: DC | PRN

## 2022-03-17 NOTE — Progress Notes (Signed)
Chief Complaint  Patient presents with   Follow-up    Subjective Christine Cooke presents for f/u anxiety/depression.  Pt is currently being treated with Lexapro 10 mg/d.  Reports doing better since treatment. Having some nausea along with it, could be from oral iron.  No thoughts of harming self or others. No self-medication with alcohol, prescription drugs or illicit drugs other than marijuana. Pt is not following with a counselor/psychologist.  Iron deficiency anemia Patient with a longstanding history of iron deficiency anemia.  She was eating around 4 to 5 cups of ice daily.  This is cut down significantly since starting 1 tablet of ferrous sulfate daily.  She is unsure if this or Lexapro is causing daily nausea.  Bowel movements are normal other than turning dark since starting iron.  Hair thinning is improving as well.  Past Medical History:  Diagnosis Date   Anemia    GERD (gastroesophageal reflux disease)    with pregnancy   Infection    UTI   Ovarian cyst    Allergies as of 03/17/2022   No Known Allergies      Medication List        Accurate as of Mar 17, 2022 11:36 AM. If you have any questions, ask your nurse or doctor.          escitalopram 10 MG tablet Commonly known as: Lexapro Take 1 tablet (10 mg total) by mouth daily.   ondansetron 4 MG disintegrating tablet Commonly known as: ZOFRAN-ODT Take 1 tablet (4 mg total) by mouth every 8 (eight) hours as needed for nausea or vomiting.        Exam BP 110/80   Pulse 83   Temp 97.9 F (36.6 C) (Oral)   Ht 5' 7.5" (1.715 m)   Wt 196 lb 8 oz (89.1 kg)   SpO2 99%   BMI 30.32 kg/m  General:  well developed, well nourished, in no apparent distress Heart: RRR Lungs: Clear to auscultation bilaterally no respiratory distress Psych: well oriented with normal range of affect and age-appropriate judgement/insight, alert and oriented x4.  Assessment and Plan  Depression, major, single episode,  moderate (HCC)  GAD (generalized anxiety disorder)  Iron deficiency anemia, unspecified iron deficiency anemia type - Plan: CBC, IBC + Ferritin  1/2.  Chronic, hopefully stable.  Continue Lexapro 10 mg daily.  Counseled on exercise.  She will let me know if the nausea improves off of the iron. 3.  Chronic, improving.  Check labs today.  Stop iron for the next several days.  If it improves, may try ferric citrate.  Hopefully can avoid referral to the hematology team for iron infusions. F/u in 5 months for a physical or as needed. The patient voiced understanding and agreement to the plan.  [place], DO 03/17/22 11:36 AM

## 2022-03-17 NOTE — Patient Instructions (Signed)
Let me know how your nausea is doing next Mon or Tues.   Stop the iron for now.   Give [place] 2-3 business days to get the results of your labs back.   Keep the diet clean and stay active.  Ice/cold pack over area for 10-15 min twice daily.  Heat (pad or rice pillow in microwave) over affected area, 10-15 minutes twice daily.   OK to take Tylenol 1000 mg (2 extra strength tabs) or 975 mg (3 regular strength tabs) every 6 hours as needed.  Let us know if you need anything.  Trapezius stretches/exercises Do exercises exactly as told by your health care provider and adjust them as directed. It is normal to feel mild stretching, pulling, tightness, or discomfort as you do these exercises, but you should stop right away if you feel sudden pain or your pain gets worse.   Stretching and range of motion exercises These exercises warm up your muscles and joints and improve the movement and flexibility of your shoulder. These exercises can also help to relieve pain, numbness, and tingling. If you are unable to do any of the following for any reason, do not further attempt to do it.   Exercise A: Flexion, standing     Stand and hold a broomstick, a cane, or a similar object. Place your hands a little more than shoulder-width apart on the object. Your left / right hand should be palm-up, and your other hand should be palm-down. Push the stick to raise your left / right arm out to your side and then over your head. Use your other hand to help move the stick. Stop when you feel a stretch in your shoulder, or when you reach the angle that is recommended by your health care provider. Avoid shrugging your shoulder while you raise your arm. Keep your shoulder blade tucked down toward your spine. Hold for 30 seconds. Slowly return to the starting position. Repeat 2 times. Complete this exercise 3 times per week.  Exercise B: Abduction, supine     Lie on your back and hold a broomstick, a cane, or a  similar object. Place your hands a little more than shoulder-width apart on the object. Your left / right hand should be palm-up, and your other hand should be palm-down. Push the stick to raise your left / right arm out to your side and then over your head. Use your other hand to help move the stick. Stop when you feel a stretch in your shoulder, or when you reach the angle that is recommended by your health care provider. Avoid shrugging your shoulder while you raise your arm. Keep your shoulder blade tucked down toward your spine. Hold for 30 seconds. Slowly return to the starting position. Repeat 2 times. Complete this exercise 3 times per week.  Exercise C: Flexion, active-assisted     Lie on your back. You may bend your knees for comfort. Hold a broomstick, a cane, or a similar object. Place your hands about shoulder-width apart on the object. Your palms should face toward your feet. Raise the stick and move your arms over your head and behind your head, toward the floor. Use your healthy arm to help your left / right arm move farther. Stop when you feel a gentle stretch in your shoulder, or when you reach the angle where your health care provider tells you to stop. Hold for 30 seconds. Slowly return to the starting position. Repeat 2 times. Complete this exercise 3  times per week.  Exercise D: External rotation and abduction     Stand in a door frame with one of your feet slightly in front of the other. This is called a staggered stance. Choose one of the following positions as told by your health care provider: Place your hands and forearms on the door frame above your head. Place your hands and forearms on the door frame at the height of your head. Place your hands on the door frame at the height of your elbows. Slowly move your weight onto your front foot until you feel a stretch across your chest and in the front of your shoulders. Keep your head and chest upright and keep your  abdominal muscles tight. Hold for 30 seconds. To release the stretch, shift your weight to your back foot. Repeat 2 times. Complete this stretch 3 times per week.  Strengthening exercises These exercises build strength and endurance in your shoulder. Endurance is the ability to use your muscles for a long time, even after your muscles get tired. Exercise E: Scapular depression and adduction  Sit on a stable chair. Support your arms in front of you with pillows, armrests, or a tabletop. Keep your elbows in line with the sides of your body. Gently move your shoulder blades down toward your middle back. Relax the muscles on the tops of your shoulders and in the back of your neck. Hold for 3 seconds. Slowly release the tension and relax your muscles completely before doing this exercise again. Repeat for a total of 10 repetitions. After you have practiced this exercise, try doing the exercise without the arm support. Then, try the exercise while standing instead of sitting. Repeat 2 times. Complete this exercise 3 times per week.  Exercise F: Shoulder abduction, isometric     Stand or sit about 4-6 inches (10-15 cm) from a wall with your left / right side facing the wall. Bend your left / right elbow and gently press your elbow against the wall. Increase the pressure slowly until you are pressing as hard as you can without shrugging your shoulder. Hold for 3 seconds. Slowly release the tension and relax your muscles completely. Repeat for a total of 10 repetitions. Repeat 2 times. Complete this exercise 3 times per week.  Exercise G: Shoulder flexion, isometric     Stand or sit about 4-6 inches (10-15 cm) away from a wall with your left / right side facing the wall. Keep your left / right elbow straight and gently press the top of your fist against the wall. Increase the pressure slowly until you are pressing as hard as you can without shrugging your shoulder. Hold for 10-15  seconds. Slowly release the tension and relax your muscles completely. Repeat for a total of 10 repetitions. Repeat 2 times. Complete this exercise 3 times per week.  Exercise H: Internal rotation     Sit in a stable chair without armrests, or stand. Secure an exercise band at your left / right side, at elbow height. Place a soft object, such as a folded towel or a small pillow, under your left / right upper arm so your elbow is a few inches (about 8 cm) away from your side. Hold the end of the exercise band so the band stretches. Keeping your elbow pressed against the soft object under your arm, move your forearm across your body toward your abdomen. Keep your body steady so the movement is only coming from your shoulder. Hold for 3  seconds. Slowly return to the starting position. Repeat for a total of 10 repetitions. Repeat 2 times. Complete this exercise 3 times per week.  Exercise I: External rotation     Sit in a stable chair without armrests, or stand. Secure an exercise band at your left / right side, at elbow height. Place a soft object, such as a folded towel or a small pillow, under your left / right upper arm so your elbow is a few inches (about 8 cm) away from your side. Hold the end of the exercise band so the band stretches. Keeping your elbow pressed against the soft object under your arm, move your forearm out, away from your abdomen. Keep your body steady so the movement is only coming from your shoulder. Hold for 3 seconds. Slowly return to the starting position. Repeat for a total of 10 repetitions. Repeat 2 times. Complete this exercise 3 times per week. Exercise J: Shoulder extension  Sit in a stable chair without armrests, or stand. Secure an exercise band to a stable object in front of you so the band is at shoulder height. Hold one end of the exercise band in each hand. Your palms should face each other. Straighten your elbows and lift your hands up to shoulder  height. Step back, away from the secured end of the exercise band, until the band stretches. Squeeze your shoulder blades together and pull your hands down to the sides of your thighs. Stop when your hands are straight down by your sides. Do not let your hands go behind your body. Hold for 3 seconds. Slowly return to the starting position. Repeat for a total of 10 repetitions. Repeat 2 times. Complete this exercise 3 times per week.  Exercise K: Shoulder extension, prone     Lie on your abdomen on a firm surface so your left / right arm hangs over the edge. Hold a 5 lb weight in your hand so your palm faces in toward your body. Your arm should be straight. Squeeze your shoulder blade down toward the middle of your back. Slowly raise your arm behind you, up to the height of the surface that you are lying on. Keep your arm straight. Hold for 3 seconds. Slowly return to the starting position and relax your muscles. Repeat for a total of 10 repetitions. Repeat 2 times. Complete this exercise 3 times per week.   Exercise L: Horizontal abduction, prone  Lie on your abdomen on a firm surface so your left / right arm hangs over the edge. Hold a 5 lb weight in your hand so your palm faces toward your feet. Your arm should be straight. Squeeze your shoulder blade down toward the middle of your back. Bend your elbow so your hand moves up, until your elbow is bent to an "L" shape (90 degrees). With your elbow bent, slowly move your forearm forward and up. Raise your hand up to the height of the surface that you are lying on. Your upper arm should not move, and your elbow should stay bent. At the top of the movement, your palm should face the floor. Hold for 3 seconds. Slowly return to the starting position and relax your muscles. Repeat for a total of 10 repetitions. Repeat 2 times. Complete this exercise 3 times per week.  Exercise M: Horizontal abduction, standing  Sit on a stable chair, or  stand. Secure an exercise band to a stable object in front of you so the band is at shoulder  height. Hold one end of the exercise band in each hand. Straighten your elbows and lift your hands straight in front of you, up to shoulder height. Your palms should face down, toward the floor. Step back, away from the secured end of the exercise band, until the band stretches. Move your arms out to your sides, and keep your arms straight. Hold for 3 seconds. Slowly return to the starting position. Repeat for a total of 10 repetitions. Repeat 2 times. Complete this exercise 3 times per week.  Exercise N: Scapular retraction and elevation  Sit on a stable chair, or stand. Secure an exercise band to a stable object in front of you so the band is at shoulder height. Hold one end of the exercise band in each hand. Your palms should face each other. Sit in a stable chair without armrests, or stand. Step back, away from the secured end of the exercise band, until the band stretches. Squeeze your shoulder blades together and lift your hands over your head. Keep your elbows straight. Hold for 3 seconds. Slowly return to the starting position. Repeat for a total of 10 repetitions. Repeat 2 times. Complete this exercise 3 times per week.  This information is not intended to replace advice given to you by your health care provider. Make sure you discuss any questions you have with your health care provider. Document Released: 10/04/2005 Document Revised: 06/10/2016 Document Reviewed: 08/21/2015 Elsevier Interactive Patient Education  2017 [employer].

## 2022-04-06 ENCOUNTER — Encounter: Payer: Self-pay | Admitting: Radiology

## 2022-04-06 ENCOUNTER — Ambulatory Visit (INDEPENDENT_AMBULATORY_CARE_PROVIDER_SITE_OTHER): Payer: BC Managed Care – PPO | Admitting: Radiology

## 2022-04-06 VITALS — BP 124/76 | Ht 66.0 in | Wt 202.0 lb

## 2022-04-06 DIAGNOSIS — N750 Cyst of Bartholin's gland: Secondary | ICD-10-CM

## 2022-04-06 DIAGNOSIS — N390 Urinary tract infection, site not specified: Secondary | ICD-10-CM | POA: Diagnosis not present

## 2022-04-06 DIAGNOSIS — M549 Dorsalgia, unspecified: Secondary | ICD-10-CM | POA: Diagnosis not present

## 2022-04-06 DIAGNOSIS — N920 Excessive and frequent menstruation with regular cycle: Secondary | ICD-10-CM | POA: Diagnosis not present

## 2022-04-06 MED ORDER — SULFAMETHOXAZOLE-TRIMETHOPRIM 800-160 MG PO TABS: 1.0000 | ORAL_TABLET | Freq: Two times a day (BID) | ORAL | 0 refills | Status: DC

## 2022-04-06 NOTE — Progress Notes (Signed)
   Christine Cooke Dec 03, 1985 [id number]   History:  36 y.o. G5P4 present with c/o worsening menorrhagia. Bleeding through a tampon and pad within an 1 hour the first 3 days. Being treated by PCP for anemia. Known hx of 'small' fibroids and ovarian cysts when pregnant. She has had a tubal ligation. Dysmenorrhea for the first 3 days of cycle. Worse when she has clotting, clots push her tampons out at times. She also has daily low back pain with or without menses. No urinary or GI symptoms.  Gynecologic History Patient's last menstrual period was 03/06/2022 (approximate). Period Cycle (Days): 28 Period Duration (Days): 7 Period Pattern: Regular Menstrual Flow: Heavy Dysmenorrhea: (!) Severe Dysmenorrhea Symptoms: Cramping Contraception/Family planning: tubal ligation Sexually active: yes   Obstetric History OB History  Gravida Para Term Preterm AB Living  5 4 4   1 4   SAB IAB Ectopic Multiple Live Births    1   0 4    # Outcome Date GA Lbr Len/2nd Weight Sex Delivery Anes PTL Lv  5 Term 09/27/14 [redacted]w[redacted]d  7 lb 10.1 oz (3.46 kg) M CS-LTranv Spinal  LIV  4 Term 07/03/12    M CS-LTranv Spinal  LIV  3 Term 05/01/09    F CS-LTranv Spinal  LIV  2 Term 10/29/06    F CS-LTranv EPI  LIV  1 IAB 2005             The following portions of the patient's history were reviewed and updated as appropriate: allergies, current medications, past family history, past medical history, past social history, past surgical history, and problem list.  Review of Systems Pertinent items noted in HPI and remainder of comprehensive ROS otherwise negative.   Past medical history, past surgical history, family history and social history were all reviewed and documented in the EPIC chart. Reviewed labs from last visit with PCP with patient.   Exam:  Vitals:   04/06/22 1538  BP: 124/76  Weight: 202 lb (91.6 kg)  Height: 5\' 6"  (1.676 m)   Body mass index is 32.6 kg/m.  General appearance:   Normal  Abdominal  Soft,nontender, without masses, guarding or rebound.  Liver/spleen:  No organomegaly noted  Hernia:  None appreciated  Genitourinary   Inguinal/mons:  Normal without inguinal adenopathy  External genitalia:left bartholin's cyst, size of a golf ball, no drainage  BUS/Urethra/Skene's glands:  Normal without masses or exudate  Vagina:  Normal appearing with normal color and discharge, no lesions  Cervix:  Normal appearing without discharge or lesions  Uterus:  Normal in size, shape and contour.  Mobile, nontender  Adnexa/parametria:     Rt: Normal in size, without masses or tenderness.   Lt: Normal in size, without masses or tenderness.  Anus and perineum: Normal  Chaperone offered and declines  Urine dipstick shows positive for RBC's and positive for ketones.  Micro exam: 10-20 RBC's per HPF and few+ bacteria.   Assessment/Plan:   1. Acute back pain, unspecified back location, unspecified back pain laterality  - Urinalysis,Complete w/RFL Culture - Pregnancy, urine  2. Menorrhagia with regular cycle Continue iron pills as prescribed - 04/08/22 Transvaginal Non-OB; Future  3. Bartholin gland cyst Declines I+D today Warm compresses and soaks Advised if  it does not resolve will need I+D - sulfamethoxazole-trimethoprim (BACTRIM DS) 800-160 MG tablet; Take 1 tablet by mouth 2 (two) times daily.  Dispense: 14 tablet; Refill: 0      Quartez [place] B WHNP-BC 4:00 PM 04/06/2022

## 2022-04-08 LAB — URINALYSIS, COMPLETE W/RFL CULTURE
Bilirubin Urine: NEGATIVE
Casts: NONE SEEN /LPF
Crystals: NONE SEEN /HPF
Glucose, UA: NEGATIVE
Hyaline Cast: NONE SEEN /LPF
Leukocyte Esterase: NEGATIVE
Nitrites, Initial: NEGATIVE
Protein, ur: NEGATIVE
Specific Gravity, Urine: 1.02 (ref 1.001–1.035)
WBC, UA: NONE SEEN /HPF (ref 0–5)
Yeast: NONE SEEN /HPF
pH: 5.5 (ref 5.0–8.0)

## 2022-04-08 LAB — URINE CULTURE
MICRO NUMBER:: 13547814
Result:: NO GROWTH
SPECIMEN QUALITY:: ADEQUATE

## 2022-04-08 LAB — CULTURE INDICATED

## 2022-04-08 LAB — PREGNANCY, URINE: Preg Test, Ur: NEGATIVE

## 2022-05-04 ENCOUNTER — Encounter: Payer: Self-pay | Admitting: Radiology

## 2022-05-04 ENCOUNTER — Ambulatory Visit (INDEPENDENT_AMBULATORY_CARE_PROVIDER_SITE_OTHER): Payer: BC Managed Care – PPO | Admitting: Radiology

## 2022-05-04 ENCOUNTER — Ambulatory Visit (INDEPENDENT_AMBULATORY_CARE_PROVIDER_SITE_OTHER): Payer: BC Managed Care – PPO

## 2022-05-04 DIAGNOSIS — N920 Excessive and frequent menstruation with regular cycle: Secondary | ICD-10-CM

## 2022-05-04 DIAGNOSIS — N83201 Unspecified ovarian cyst, right side: Secondary | ICD-10-CM

## 2022-05-04 MED ORDER — JUNEL FE 24 1-20 MG-MCG(24) PO TABS: 1.0000 | ORAL_TABLET | Freq: Every day | ORAL | 0 refills | Status: DC

## 2022-05-04 NOTE — Progress Notes (Signed)
   Christine Cooke 15-Oct-1986 [id number]   History:  36 y.o. G5P4 presents for ultrasound to evaluate worsening menorrhagia and back pain. Has been bleeding through a tampon and pad within an 1 hour the first 3 days. Being treated by PCP for anemia. Known hx of 'small' fibroids and ovarian cysts when pregnant. She has had a tubal ligation. Dysmenorrhea for the first 3 days of cycle. Worse when she has clotting, clots push her tampons out at times.   Gynecologic History Patient's last menstrual period was 03/06/2022 (approximate).   Contraception/Family planning: tubal ligation Sexually active: yes   Obstetric History OB History  Gravida Para Term Preterm AB Living  5 4 4   1 4   SAB IAB Ectopic Multiple Live Births    1   0 4    # Outcome Date GA Lbr Len/2nd Weight Sex Delivery Anes PTL Lv  5 Term 09/27/14 [redacted]w[redacted]d  7 lb 10.1 oz (3.46 kg) M CS-LTranv Spinal  LIV  4 Term 07/03/12    M CS-LTranv Spinal  LIV  3 Term 05/01/09    F CS-LTranv Spinal  LIV  2 Term 10/29/06    F CS-LTranv EPI  LIV  1 IAB 2005             The following portions of the patient's history were reviewed and updated as appropriate: allergies, current medications, past family history, past medical history, past social history, past surgical history, and problem list.  Review of Systems Pertinent items noted in HPI and remainder of comprehensive ROS otherwise negative.   Past medical history, past surgical history, family history and social history were all reviewed and documented in the EPIC chart. Reviewed labs from last visit with PCP with patient.   Exam:  There were no vitals filed for this visit.  There is no height or weight on file to calculate BMI.  Narrative & Impression Indication: Menorrhagia, back pain   Method vaginal ultrasound   Anteverted uterus, normal size and shape No myometrial masses   Symmetrical secretory endometrium approximately 5.63mm No obvious masses, thickening or abnormal  blood flow   Both ovaries mobile with normal perfusion Right ovary enlarged with two cystic structures: 3.6 x 3.1cm simple cyst 2.1 x 1.6cm cyst with debris         Exam Ended: 05/04/22 15:02 Last Resulted: 05/04/22 15:45        General appearance:  Normal  Abdominal  Soft,nontender, without masses, guarding or rebound.  Liver/spleen:  No organomegaly noted  Hernia:  None appreciated   Assessment/Plan:   1. Cyst of right ovary Begin OCPs to help with resolution Repeat u/s 12 weeks  - 05/06/22 Transvaginal Non-OB; Future  2. Menorrhagia with regular cycle  - Norethindrone Acetate-Ethinyl Estrad-FE (JUNEL FE 24) 1-20 MG-MCG(24) tablet; Take 1 tablet by mouth daily.  Dispense: 84 tablet; Refill: 0     Keno Caraway B WHNP-BC 3:44 PM 05/04/2022

## 2022-05-13 ENCOUNTER — Other Ambulatory Visit: Payer: BC Managed Care – PPO

## 2022-05-13 ENCOUNTER — Other Ambulatory Visit: Payer: Self-pay | Admitting: Radiology

## 2022-08-10 ENCOUNTER — Ambulatory Visit: Payer: BC Managed Care – PPO | Admitting: Family Medicine

## 2022-08-12 ENCOUNTER — Ambulatory Visit: Payer: BC Managed Care – PPO | Admitting: Family Medicine

## 2022-08-12 ENCOUNTER — Encounter: Payer: Self-pay | Admitting: Family Medicine

## 2022-09-22 ENCOUNTER — Encounter: Payer: BC Managed Care – PPO | Admitting: Obstetrics and Gynecology

## 2022-11-10 ENCOUNTER — Encounter: Payer: BC Managed Care – PPO | Admitting: Obstetrics and Gynecology

## 2022-12-20 ENCOUNTER — Other Ambulatory Visit: Payer: Self-pay | Admitting: Family Medicine

## 2022-12-20 ENCOUNTER — Encounter: Payer: Self-pay | Admitting: Family Medicine

## 2022-12-20 ENCOUNTER — Ambulatory Visit (INDEPENDENT_AMBULATORY_CARE_PROVIDER_SITE_OTHER): Payer: BC Managed Care – PPO | Admitting: Family Medicine

## 2022-12-20 VITALS — BP 120/82 | HR 74 | Temp 97.9°F | Ht 66.5 in | Wt 201.2 lb

## 2022-12-20 DIAGNOSIS — M545 Low back pain, unspecified: Secondary | ICD-10-CM

## 2022-12-20 DIAGNOSIS — D509 Iron deficiency anemia, unspecified: Secondary | ICD-10-CM

## 2022-12-20 DIAGNOSIS — F321 Major depressive disorder, single episode, moderate: Secondary | ICD-10-CM | POA: Diagnosis not present

## 2022-12-20 DIAGNOSIS — F411 Generalized anxiety disorder: Secondary | ICD-10-CM | POA: Insufficient documentation

## 2022-12-20 LAB — CBC
HCT: 34 % — ABNORMAL LOW (ref 36.0–46.0)
Hemoglobin: 11 g/dL — ABNORMAL LOW (ref 12.0–15.0)
MCHC: 32.3 g/dL (ref 30.0–36.0)
MCV: 83.3 fl (ref 78.0–100.0)
Platelets: 247 10*3/uL (ref 150.0–400.0)
RBC: 4.09 Mil/uL (ref 3.87–5.11)
RDW: 17.6 % — ABNORMAL HIGH (ref 11.5–15.5)
WBC: 8.6 10*3/uL (ref 4.0–10.5)

## 2022-12-20 LAB — IBC + FERRITIN
Ferritin: 5.6 ng/mL — ABNORMAL LOW (ref 10.0–291.0)
Iron: 15 ug/dL — ABNORMAL LOW (ref 42–145)
Saturation Ratios: 3.6 % — ABNORMAL LOW (ref 20.0–50.0)
TIBC: 418.6 ug/dL (ref 250.0–450.0)
Transferrin: 299 mg/dL (ref 212.0–360.0)

## 2022-12-20 MED ORDER — SERTRALINE HCL 50 MG PO TABS: 50.0000 mg | ORAL_TABLET | Freq: Every day | ORAL | 3 refills | Status: DC

## 2022-12-20 NOTE — Patient Instructions (Addendum)
Please consider counseling. Contact [phone] to schedule an appointment or inquire about cost/insurance coverage.  [facility] located at [place], [place], [place].  Phone number = [phone].  Dr. Lennice Sites - Adult Psychiatry.    [facility] located at [place], [place], [place]. Phone number = [phone].   [facility] located at [address] [place], [place].  Phone number = [phone].   [facility] located at [place], [place], [place].  Phone number = [phone].  Aim to do some physical exertion for 150 minutes per week. This is typically divided into 5 days per week, 30 minutes per day. The activity should be enough to get your heart rate up. Anything is better than nothing if you have time constraints.  Heat (pad or rice pillow in microwave) over affected area, 10-15 minutes twice daily.   Ice/cold pack over area for 10-15 min twice daily.  OK to take Tylenol 1000 mg (2 extra strength tabs) or 975 mg (3 regular strength tabs) every 6 hours as needed.  Let us know if you need anything.  EXERCISES  RANGE OF MOTION (ROM) AND STRETCHING EXERCISES - Low Back Pain Most people with lower back pain will find that their symptoms get worse with excessive bending forward (flexion) or arching at the lower back (extension). The exercises that will help resolve your symptoms will focus on the opposite motion.  If you have pain, numbness or tingling which travels down into your buttocks, leg or foot, the goal of the therapy is for these symptoms to move closer to your back and eventually resolve. Sometimes, these leg symptoms will get better, but your lower back pain may worsen. This is often an indication of progress in your rehabilitation. Be very alert to any changes in your symptoms and the activities in which you participated in the 24 hours prior to the change. Sharing this  information with your caregiver will allow him or her to most efficiently treat your condition. These exercises may help you when beginning to rehabilitate your injury. Your symptoms may resolve with or without further involvement from your physician, physical therapist or athletic trainer. While completing these exercises, remember:  Restoring tissue flexibility helps normal motion to return to the joints. This allows healthier, less painful movement and activity. An effective stretch should be held for at least 30 seconds. A stretch should never be painful. You should only feel a gentle lengthening or release in the stretched tissue. FLEXION RANGE OF MOTION AND STRETCHING EXERCISES:  STRETCH - Flexion, Single Knee to Chest  Lie on a firm bed or floor with both legs extended in front of you. Keeping one leg in contact with the floor, bring your opposite knee to your chest. Hold your leg in place by either grabbing behind your thigh or at your knee. Pull until you feel a gentle stretch in your low back. Hold 30 seconds. Slowly release your grasp and repeat the exercise with the opposite side. Repeat 2 times. Complete this exercise 3 times per week.   STRETCH - Flexion, Double Knee to Chest Lie on a firm bed or floor with both legs extended in front of you. Keeping one leg in contact with the floor, bring your opposite knee to your chest. Tense your stomach muscles to support your back and then lift your other knee to your chest. Hold your legs in place by either grabbing behind your thighs or at your  knees. Pull both knees toward your chest until you feel a gentle stretch in your low back. Hold 30 seconds. Tense your stomach muscles and slowly return one leg at a time to the floor. Repeat 2 times. Complete this exercise 3 times per week.   STRETCH - Low Trunk Rotation Lie on a firm bed or floor. Keeping your legs in front of you, bend your knees so they are both pointed toward the ceiling and  your feet are flat on the floor. Extend your arms out to the side. This will stabilize your upper body by keeping your shoulders in contact with the floor. Gently and slowly drop both knees together to one side until you feel a gentle stretch in your low back. Hold for 30 seconds. Tense your stomach muscles to support your lower back as you bring your knees back to the starting position. Repeat the exercise to the other side. Repeat 2 times. Complete this exercise at least 3 times per week.   EXTENSION RANGE OF MOTION AND FLEXIBILITY EXERCISES:  STRETCH - Extension, Prone on Elbows  Lie on your stomach on the floor, a bed will be too soft. Place your palms about shoulder width apart and at the height of your head. Place your elbows under your shoulders. If this is too painful, stack pillows under your chest. Allow your body to relax so that your hips drop lower and make contact more completely with the floor. Hold this position for 30 seconds. Slowly return to lying flat on the floor. Repeat 2 times. Complete this exercise 3 times per week.   RANGE OF MOTION - Extension, Prone Press Ups Lie on your stomach on the floor, a bed will be too soft. Place your palms about shoulder width apart and at the height of your head. Keeping your back as relaxed as possible, slowly straighten your elbows while keeping your hips on the floor. You may adjust the placement of your hands to maximize your comfort. As you gain motion, your hands will come more underneath your shoulders. Hold this position 30 seconds. Slowly return to lying flat on the floor. Repeat 2 times. Complete this exercise 3 times per week.   RANGE OF MOTION- Quadruped, Neutral Spine  Assume a hands and knees position on a firm surface. Keep your hands under your shoulders and your knees under your hips. You may place padding under your knees for comfort. Drop your head and point your tailbone toward the ground below you. This will round  out your lower back like an angry cat. Hold this position for 30 seconds. Slowly lift your head and release your tail bone so that your back sags into a large arch, like an old horse. Hold this position for 30 seconds. Repeat this until you feel limber in your low back. Now, find your "sweet spot." This will be the most comfortable position somewhere between the two previous positions. This is your neutral spine. Once you have found this position, tense your stomach muscles to support your low back. Hold this position for 30 seconds. Repeat 2 times. Complete this exercise 3 times per week.   STRENGTHENING EXERCISES - Low Back Sprain These exercises may help you when beginning to rehabilitate your injury. These exercises should be done near your "sweet spot." This is the neutral, low-back arch, somewhere between fully rounded and fully arched, that is your least painful position. When performed in this safe range of motion, these exercises can be used for people who  have either a flexion or extension based injury. These exercises may resolve your symptoms with or without further involvement from your physician, physical therapist or athletic trainer. While completing these exercises, remember:  Muscles can gain both the endurance and the strength needed for everyday activities through controlled exercises. Complete these exercises as instructed by your physician, physical therapist or athletic trainer. Increase the resistance and repetitions only as guided. You may experience muscle soreness or fatigue, but the pain or discomfort you are trying to eliminate should never worsen during these exercises. If this pain does worsen, stop and make certain you are following the directions exactly. If the pain is still present after adjustments, discontinue the exercise until you can discuss the trouble with your caregiver.  STRENGTHENING - Deep Abdominals, Pelvic Tilt  Lie on a firm bed or floor. Keeping your  legs in front of you, bend your knees so they are both pointed toward the ceiling and your feet are flat on the floor. Tense your lower abdominal muscles to press your low back into the floor. This motion will rotate your pelvis so that your tail bone is scooping upwards rather than pointing at your feet or into the floor. With a gentle tension and even breathing, hold this position for 3 seconds. Repeat 2 times. Complete this exercise 3 times per week.   STRENGTHENING - Abdominals, Crunches  Lie on a firm bed or floor. Keeping your legs in front of you, bend your knees so they are both pointed toward the ceiling and your feet are flat on the floor. Cross your arms over your chest. Slightly tip your chin down without bending your neck. Tense your abdominals and slowly lift your trunk high enough to just clear your shoulder blades. Lifting higher can put excessive stress on the lower back and does not further strengthen your abdominal muscles. Control your return to the starting position. Repeat 2 times. Complete this exercise 3 times per week.   STRENGTHENING - Quadruped, Opposite UE/LE Lift  Assume a hands and knees position on a firm surface. Keep your hands under your shoulders and your knees under your hips. You may place padding under your knees for comfort. Find your neutral spine and gently tense your abdominal muscles so that you can maintain this position. Your shoulders and hips should form a rectangle that is parallel with the floor and is not twisted. Keeping your trunk steady, lift your right hand no higher than your shoulder and then your left leg no higher than your hip. Make sure you are not holding your breath. Hold this position for 30 seconds. Continuing to keep your abdominal muscles tense and your back steady, slowly return to your starting position. Repeat with the opposite arm and leg. Repeat 2 times. Complete this exercise 3 times per week.   STRENGTHENING - Abdominals and  Quadriceps, Straight Leg Raise  Lie on a firm bed or floor with both legs extended in front of you. Keeping one leg in contact with the floor, bend the other knee so that your foot can rest flat on the floor. Find your neutral spine, and tense your abdominal muscles to maintain your spinal position throughout the exercise. Slowly lift your straight leg off the floor about 6 inches for a count of 3, making sure to not hold your breath. Still keeping your neutral spine, slowly lower your leg all the way to the floor. Repeat this exercise with each leg 2 times. Complete this exercise 3 times per  week.  POSTURE AND BODY MECHANICS CONSIDERATIONS - Low Back Sprain Keeping correct posture when sitting, standing or completing your activities will reduce the stress put on different body tissues, allowing injured tissues a chance to heal and limiting painful experiences. The following are general guidelines for improved posture.  While reading these guidelines, remember: The exercises prescribed by your provider will help you have the flexibility and strength to maintain correct postures. The correct posture provides the best environment for your joints to work. All of your joints have less wear and tear when properly supported by a spine with good posture. This means you will experience a healthier, less painful body. Correct posture must be practiced with all of your activities, especially prolonged sitting and standing. Correct posture is as important when doing repetitive low-stress activities (typing) as it is when doing a single heavy-load activity (lifting).  RESTING POSITIONS Consider which positions are most painful for you when choosing a resting position. If you have pain with flexion-based activities (sitting, bending, stooping, squatting), choose a position that allows you to rest in a less flexed posture. You would want to avoid curling into a fetal position on your side. If your pain worsens with  extension-based activities (prolonged standing, working overhead), avoid resting in an extended position such as sleeping on your stomach. Most people will find more comfort when they rest with their spine in a more neutral position, neither too rounded nor too arched. Lying on a non-sagging bed on your side with a pillow between your knees, or on your back with a pillow under your knees will often provide some relief. Keep in mind, being in any one position for a prolonged period of time, no matter how correct your posture, can still lead to stiffness.  PROPER SITTING POSTURE In order to minimize stress and discomfort on your spine, you must sit with correct posture. Sitting with good posture should be effortless for a healthy body. Returning to good posture is a gradual process. Many people can work toward this most comfortably by using various supports until they have the flexibility and strength to maintain this posture on their own. When sitting with proper posture, your ears will fall over your shoulders and your shoulders will fall over your hips. You should use the back of the chair to support your upper back. Your lower back will be in a neutral position, just slightly arched. You may place a small pillow or folded towel at the base of your lower back for  support.  When working at a desk, create an environment that supports good, upright posture. Without extra support, muscles tire, which leads to excessive strain on joints and other tissues. Keep these recommendations in mind:  CHAIR: A chair should be able to slide under your desk when your back makes contact with the back of the chair. This allows you to work closely. The chair's height should allow your eyes to be level with the upper part of your monitor and your hands to be slightly lower than your elbows.  BODY POSITION Your feet should make contact with the floor. If this is not possible, use a foot rest. Keep your ears over your  shoulders. This will reduce stress on your neck and low back.  INCORRECT SITTING POSTURES  If you are feeling tired and unable to assume a healthy sitting posture, do not slouch or slump. This puts excessive strain on your back tissues, causing more damage and pain. Healthier options include: Using more  support, like a lumbar pillow. Switching tasks to something that requires you to be upright or walking. Talking a brief walk. Lying down to rest in a neutral-spine position.  PROLONGED STANDING WHILE SLIGHTLY LEANING FORWARD  When completing a task that requires you to lean forward while standing in one place for a long time, place either foot up on a stationary 2-4 inch high object to help maintain the best posture. When both feet are on the ground, the lower back tends to lose its slight inward curve. If this curve flattens (or becomes too large), then the back and your other joints will experience too much stress, tire more quickly, and can cause pain.  CORRECT STANDING POSTURES Proper standing posture should be assumed with all daily activities, even if they only take a few moments, like when brushing your teeth. As in sitting, your ears should fall over your shoulders and your shoulders should fall over your hips. You should keep a slight tension in your abdominal muscles to brace your spine. Your tailbone should point down to the ground, not behind your body, resulting in an over-extended swayback posture.   INCORRECT STANDING POSTURES  Common incorrect standing postures include a forward head, locked knees and/or an excessive swayback. WALKING Walk with an upright posture. Your ears, shoulders and hips should all line-up.  PROLONGED ACTIVITY IN A FLEXED POSITION When completing a task that requires you to bend forward at your waist or lean over a low surface, try to find a way to stabilize 3 out of 4 of your limbs. You can place a hand or elbow on your thigh or rest a knee on the surface  you are reaching across. This will provide you more stability, so that your muscles do not tire as quickly. By keeping your knees relaxed, or slightly bent, you will also reduce stress across your lower back. CORRECT LIFTING TECHNIQUES  DO : Assume a wide stance. This will provide you more stability and the opportunity to get as close as possible to the object which you are lifting. Tense your abdominals to brace your spine. Bend at the knees and hips. Keeping your back locked in a neutral-spine position, lift using your leg muscles. Lift with your legs, keeping your back straight. Test the weight of unknown objects before attempting to lift them. Try to keep your elbows locked down at your sides in order get the best strength from your shoulders when carrying an object.   Always ask for help when lifting heavy or awkward objects. INCORRECT LIFTING TECHNIQUES DO NOT:  Lock your knees when lifting, even if it is a small object. Bend and twist. Pivot at your feet or move your feet when needing to change directions. Assume that you can safely pick up even a paperclip without proper posture.

## 2022-12-20 NOTE — Progress Notes (Signed)
Chief Complaint  Patient presents with   Anxiety    Back pain Side pain Lab work done today for fatigue     Subjective Christine Cooke presents for f/u anxiety/depression.  Pt is currently being treated with  No thoughts of harming self or others. No self-medication with alcohol, prescription drugs or illicit drugs. Pt is not following with a counselor/psychologist.  Bilateral mid-low back pain. Worse on the R. Started 2 mo ago. No inj or change in activity. No current bruising, redness, swelling. Certain movements make it worse. Associated tingling down her RLE. No consistent loss of bowel/bladder function.  She does have weakness in RLE.   Past Medical History:  Diagnosis Date   Anemia    GERD (gastroesophageal reflux disease)    with pregnancy   Infection    UTI   Mood swings    Ovarian cyst    Allergies as of 12/20/2022   No Known Allergies      Medication List        Accurate as of December 20, 2022  9:25 AM. If you have any questions, ask your nurse or doctor.          STOP taking these medications    escitalopram 10 MG tablet Commonly known as: Lexapro Stopped by: Shelda Pal, DO   IBUPROFEN PO Stopped by: Shelda Pal, DO   ondansetron 4 MG disintegrating tablet Commonly known as: ZOFRAN-ODT Stopped by: Shelda Pal, DO   sulfamethoxazole-trimethoprim 800-160 MG tablet Commonly known as: BACTRIM DS Stopped by: Shelda Pal, DO       TAKE these medications    Junel Fe 24 1-20 MG-MCG(24) tablet Generic drug: Norethindrone Acetate-Ethinyl Estrad-FE Take 1 tablet by mouth daily.   sertraline 50 MG tablet Commonly known as: ZOLOFT Take 1 tablet (50 mg total) by mouth daily. Take 1/2 tab daily for first 2 weeks. Started by: Shelda Pal, DO        Exam BP 120/82 (BP Location: Left Arm, Patient Position: Sitting, Cuff Size: Normal)   Pulse 74   Temp 97.9 F (36.6 C) (Oral)   Ht 5' 6.5"  (1.689 m)   Wt 201 lb 4 oz (91.3 kg)   SpO2 98%   BMI 32.00 kg/m  General:  well developed, well nourished, in no apparent distress MSK: TTP in the lower thoracic and lumbar paraspinal musculature and erector spinae muscle group bilaterally, no midline TTP, poor hamstring range of motion bilaterally Neuro: DTRs equal and symmetric throughout, no clonus, no cerebellar signs, gait is normal, 5/5 strength throughout the lower extremities bilaterally, negative straight leg bilaterally Lungs:  No respiratory distress Psych: well oriented with normal range of affect and age-appropriate judgement/insight, alert and oriented x4.  Assessment and Plan  Depression, major, single episode, moderate (HCC) - Plan: sertraline (ZOLOFT) 50 MG tablet  GAD (generalized anxiety disorder) - Plan: sertraline (ZOLOFT) 50 MG tablet  Bilateral low back pain without sciatica, unspecified chronicity  Iron deficiency anemia, unspecified iron deficiency anemia type - Plan: IBC + Ferritin, CBC  1/2.  Chronic, unstable.  Counseling information provided in her paperwork.  Start Zoloft 25 mg daily for 2 weeks and then increase to 50 mg daily.  Counseled on exercise helping.  Follow-up in 6 weeks. 3.  Heat, ice, Tylenol, stretches and exercises.  Send message if no better in the next month.  Will set up with physical therapy. 4.  Follow-up on low iron.  She is taking iron twice  daily.  If she takes more, she will get abdominal upset.  If ferritin is less than 50 or iron is deficient, we will refer to hematology. The patient voiced understanding and agreement to the plan.  [place], DO 12/20/22 9:25 AM

## 2022-12-22 ENCOUNTER — Telehealth: Payer: Self-pay | Admitting: Physician Assistant

## 2022-12-22 NOTE — Telephone Encounter (Signed)
Scheduled appt per 3/4 referral. Pt is aware of appt date and time. Pt is aware to arrive 15 mins prior to appt time and to bring and updated insurance card. Pt is aware of appt location.

## 2023-01-07 ENCOUNTER — Other Ambulatory Visit: Payer: Self-pay

## 2023-01-07 DIAGNOSIS — D649 Anemia, unspecified: Secondary | ICD-10-CM

## 2023-01-07 NOTE — Progress Notes (Unsigned)
[place] [phone]   [phone]  CONSULT NOTE  REFERRING PHYSICIAN:  REASON FOR CONSULTATION:  Iron Deficiency Anemia   HPI Christine Cooke is a 37 y.o. female  ***With a past medical history significant for depression and generalized anxiety     HPI  Past Medical History:  Diagnosis Date   Anemia    GERD (gastroesophageal reflux disease)    with pregnancy   Infection    UTI   Mood swings    Ovarian cyst     Past Surgical History:  Procedure Laterality Date   CESAREAN SECTION     CESAREAN SECTION WITH BILATERAL TUBAL LIGATION  09/27/2014   Procedure: CESAREAN SECTION WITH BILATERAL TUBAL LIGATION;  Surgeon: Mora Bellman, MD;  Location: [facility];  Service: Obstetrics;;   INDUCED ABORTION     TUBAL LIGATION      Family History  Problem Relation Age of Onset   Hypertension Mother    Sickle cell anemia Sister    Asthma Sister    Diabetes Maternal Aunt    Hypertension Maternal Aunt    Endometrial cancer Paternal Aunt    Stroke Maternal Grandmother    Hypertension Maternal Grandmother    Hyperlipidemia Maternal Grandmother    Heart disease Maternal Grandmother    Diabetes Maternal Grandmother    Asthma Son     Social History Social History   Tobacco Use   Smoking status: Former    Packs/day: 0.25    Years: 2.00    Additional pack years: 0.00    Total pack years: 0.50    Types: Cigarettes, E-cigarettes    Quit date: 09/25/2009    Years since quitting: 13.2   Smokeless tobacco: Never   Tobacco comments:    VAPES  Vaping Use   Vaping Use: Never used  Substance Use Topics   Alcohol use: Yes    Comment: weekends   Drug use: Yes    Types: Marijuana    No Known Allergies  Current Outpatient Medications  Medication Sig Dispense Refill   Norethindrone Acetate-Ethinyl Estrad-FE (JUNEL FE 24) 1-20 MG-MCG(24) tablet Take 1 tablet by mouth daily. 84 tablet 0   sertraline (ZOLOFT) 50 MG tablet Take 1 tablet  (50 mg total) by mouth daily. Take 1/2 tab daily for first 2 weeks. 30 tablet 3   No current facility-administered medications for this visit.    REVIEW OF SYSTEMS:   Review of Systems  Constitutional: Negative for appetite change, chills, fatigue, fever and unexpected weight change.  HENT:   Negative for mouth sores, nosebleeds, sore throat and trouble swallowing.   Eyes: Negative for eye problems and icterus.  Respiratory: Negative for cough, hemoptysis, shortness of breath and wheezing.   Cardiovascular: Negative for chest pain and leg swelling.  Gastrointestinal: Negative for abdominal pain, constipation, diarrhea, nausea and vomiting.  Genitourinary: Negative for bladder incontinence, difficulty urinating, dysuria, frequency and hematuria.   Musculoskeletal: Negative for back pain, gait problem, neck pain and neck stiffness.  Skin: Negative for itching and rash.  Neurological: Negative for dizziness, extremity weakness, gait problem, headaches, light-headedness and seizures.  Hematological: Negative for adenopathy. Does not bruise/bleed easily.  Psychiatric/Behavioral: Negative for confusion, depression and sleep disturbance. The patient is not nervous/anxious.     PHYSICAL EXAMINATION:  There were no vitals taken for this visit.  ECOG PERFORMANCE STATUS: {CHL ONC ECOG [id number]  Physical Exam  Constitutional: Oriented to person, place, and time and well-developed, well-nourished, and in no  distress. No distress.  HENT:  Head: Normocephalic and atraumatic.  Mouth/Throat: Oropharynx is clear and moist. No oropharyngeal exudate.  Eyes: Conjunctivae are normal. Right eye exhibits no discharge. Left eye exhibits no discharge. No scleral icterus.  Neck: Normal range of motion. Neck supple.  Cardiovascular: Normal rate, regular rhythm, normal heart sounds and intact distal pulses.   Pulmonary/Chest: Effort normal and breath sounds normal. No respiratory distress. No wheezes. No  rales.  Abdominal: Soft. Bowel sounds are normal. Exhibits no distension and no mass. There is no tenderness.  Musculoskeletal: Normal range of motion. Exhibits no edema.  Lymphadenopathy:    No cervical adenopathy.  Neurological: Alert and oriented to person, place, and time. Exhibits normal muscle tone. Gait normal. Coordination normal.  Skin: Skin is warm and dry. No rash noted. Not diaphoretic. No erythema. No pallor.  Psychiatric: Mood, memory and judgment normal.  Vitals reviewed.  LABORATORY DATA: Lab Results  Component Value Date   WBC 8.6 12/20/2022   HGB 11.0 (L) 12/20/2022   HCT 34.0 (L) 12/20/2022   MCV 83.3 12/20/2022   PLT 247.0 12/20/2022      Chemistry      Component Value Date/Time   NA 138 02/17/2022 0955   K 3.8 02/17/2022 0955   CL 106 02/17/2022 0955   CO2 28 02/17/2022 0955   BUN 7 02/17/2022 0955   CREATININE 0.80 02/17/2022 0955      Component Value Date/Time   CALCIUM 8.8 02/17/2022 0955   ALKPHOS 60 02/17/2022 0955   AST 14 02/17/2022 0955   ALT 10 02/17/2022 0955   BILITOT 0.7 02/17/2022 0955       RADIOGRAPHIC STUDIES: No results found.  ASSESSMENT:   PLAN:  The patient voices understanding of current disease status and treatment options and is in agreement with the current care plan.  All questions were answered. The patient knows to call the clinic with any problems, questions or concerns. We can certainly see the patient much sooner if necessary.  Thank you so much for allowing me to participate in the care of Christine Cooke. I will continue to follow up the patient with you and assist in her care.  I spent {CHL ONC TIME VISIT - [id number] counseling the patient face to face. The total time spent in the appointment was {CHL ONC TIME VISIT - [id number].  Disclaimer: This note was dictated with voice recognition software. Similar sounding words can inadvertently be transcribed and may not be corrected upon  review.   Elanda Garmany L Aaniyah Strohm January 07, 2023, 3:31 PM

## 2023-01-11 ENCOUNTER — Inpatient Hospital Stay: Payer: BC Managed Care – PPO | Attending: Physician Assistant | Admitting: Physician Assistant

## 2023-01-11 ENCOUNTER — Telehealth: Payer: Self-pay | Admitting: Pharmacy Technician

## 2023-01-11 ENCOUNTER — Other Ambulatory Visit: Payer: Self-pay | Admitting: Physician Assistant

## 2023-01-11 ENCOUNTER — Telehealth: Payer: Self-pay

## 2023-01-11 ENCOUNTER — Inpatient Hospital Stay: Payer: BC Managed Care – PPO

## 2023-01-11 VITALS — BP 145/95 | HR 75 | Temp 97.2°F | Resp 18 | Ht 63.0 in | Wt 201.6 lb

## 2023-01-11 DIAGNOSIS — F17290 Nicotine dependence, other tobacco product, uncomplicated: Secondary | ICD-10-CM | POA: Diagnosis not present

## 2023-01-11 DIAGNOSIS — D649 Anemia, unspecified: Secondary | ICD-10-CM

## 2023-01-11 DIAGNOSIS — D500 Iron deficiency anemia secondary to blood loss (chronic): Secondary | ICD-10-CM

## 2023-01-11 DIAGNOSIS — N920 Excessive and frequent menstruation with regular cycle: Secondary | ICD-10-CM

## 2023-01-11 DIAGNOSIS — E538 Deficiency of other specified B group vitamins: Secondary | ICD-10-CM | POA: Insufficient documentation

## 2023-01-11 DIAGNOSIS — D509 Iron deficiency anemia, unspecified: Secondary | ICD-10-CM | POA: Insufficient documentation

## 2023-01-11 LAB — CMP (CANCER CENTER ONLY)
ALT: 16 U/L (ref 0–44)
AST: 20 U/L (ref 15–41)
Albumin: 4.3 g/dL (ref 3.5–5.0)
Alkaline Phosphatase: 64 U/L (ref 38–126)
Anion gap: 6 (ref 5–15)
BUN: 12 mg/dL (ref 6–20)
CO2: 25 mmol/L (ref 22–32)
Calcium: 9.1 mg/dL (ref 8.9–10.3)
Chloride: 109 mmol/L (ref 98–111)
Creatinine: 0.84 mg/dL (ref 0.44–1.00)
GFR, Estimated: >60 mL/min (ref >60)
Glucose, Bld: 98 mg/dL (ref 70–99)
Potassium: 3.8 mmol/L (ref 3.5–5.1)
Sodium: 140 mmol/L (ref 135–145)
Total Bilirubin: 0.5 mg/dL (ref 0.3–1.2)
Total Protein: 7.3 g/dL (ref 6.5–8.1)

## 2023-01-11 LAB — CBC WITH DIFFERENTIAL (CANCER CENTER ONLY)
Abs Immature Granulocytes: 0.02 10*3/uL (ref 0.00–0.07)
Basophils Absolute: 0 10*3/uL (ref 0.0–0.1)
Basophils Relative: 0 %
Eosinophils Absolute: 0 10*3/uL (ref 0.0–0.5)
Eosinophils Relative: 0 %
HCT: 33.1 % — ABNORMAL LOW (ref 36.0–46.0)
Hemoglobin: 10.6 g/dL — ABNORMAL LOW (ref 12.0–15.0)
Immature Granulocytes: 0 %
Lymphocytes Relative: 26 %
Lymphs Abs: 2.4 10*3/uL (ref 0.7–4.0)
MCH: 26.9 pg (ref 26.0–34.0)
MCHC: 32 g/dL (ref 30.0–36.0)
MCV: 84 fL (ref 80.0–100.0)
Monocytes Absolute: 0.6 10*3/uL (ref 0.1–1.0)
Monocytes Relative: 6 %
Neutro Abs: 6.1 10*3/uL (ref 1.7–7.7)
Neutrophils Relative %: 68 %
Platelet Count: 247 10*3/uL (ref 150–400)
RBC: 3.94 MIL/uL (ref 3.87–5.11)
RDW: 16.7 % — ABNORMAL HIGH (ref 11.5–15.5)
WBC Count: 9.2 10*3/uL (ref 4.0–10.5)
nRBC: 0 % (ref 0.0–0.2)

## 2023-01-11 LAB — VITAMIN B12: Vitamin B-12: 364 pg/mL (ref 180–914)

## 2023-01-11 LAB — FERRITIN: Ferritin: 6 ng/mL — ABNORMAL LOW (ref 11–307)

## 2023-01-11 LAB — FOLATE: Folate: 2.8 ng/mL — ABNORMAL LOW (ref >5.9)

## 2023-01-11 MED ORDER — INTEGRA PLUS PO CAPS: 1.0000 | ORAL_CAPSULE | Freq: Every morning | ORAL | 2 refills | Status: DC

## 2023-01-11 MED ORDER — FOLIC ACID 1 MG PO TABS: 1.0000 mg | ORAL_TABLET | Freq: Every day | ORAL | 2 refills | Status: DC

## 2023-01-11 NOTE — Telephone Encounter (Signed)
Attempted to reach patient related to lab results and provider recommendations.  Patient needs folic acid and the provider has called a prescription to her local pharmacy.  There is no answer.  This nurse will also send My Chart message.  No further concerns noted at this time.

## 2023-01-11 NOTE — Telephone Encounter (Signed)
Dr. Toya Smothers, Fyi note:  Auth Submission: NO AUTH NEEDED Site of care: Site of care: [facility] Payer: [place] [employer] [place] Medication & CPT/J Code(s) submitted: Venofer (Iron Sucrose) J1756 Route of submission (phone, fax, portal):  Phone # Fax # Auth type: Buy/Bill Units/visits requested: 3 Reference number: insurance verificationWess Cooke [id number] Approval from: 01/11/23 to 05/13/23   Patient will be scheduled as soon as possible

## 2023-01-12 LAB — IRON AND IRON BINDING CAPACITY (CC-WL,HP ONLY)
Iron: 19 ug/dL — ABNORMAL LOW (ref 28–170)
Saturation Ratios: 4 % — ABNORMAL LOW (ref 10.4–31.8)
TIBC: 448 ug/dL (ref 250–450)
UIBC: 429 ug/dL (ref 148–442)

## 2023-01-13 ENCOUNTER — Ambulatory Visit: Payer: Medicaid Other | Admitting: Radiology

## 2023-01-19 ENCOUNTER — Ambulatory Visit (INDEPENDENT_AMBULATORY_CARE_PROVIDER_SITE_OTHER): Payer: BC Managed Care – PPO

## 2023-01-19 VITALS — BP 119/79 | HR 74 | Temp 98.0°F | Resp 16 | Ht 67.0 in | Wt 201.6 lb

## 2023-01-19 DIAGNOSIS — N920 Excessive and frequent menstruation with regular cycle: Secondary | ICD-10-CM

## 2023-01-19 DIAGNOSIS — D500 Iron deficiency anemia secondary to blood loss (chronic): Secondary | ICD-10-CM

## 2023-01-19 MED ORDER — ACETAMINOPHEN 325 MG PO TABS
650.0000 mg | ORAL_TABLET | Freq: Once | ORAL | Status: AC
  Administered 2023-01-19: 650 mg via ORAL
  Filled 2023-01-19: qty 2

## 2023-01-19 MED ORDER — DIPHENHYDRAMINE HCL 25 MG PO CAPS
25.0000 mg | ORAL_CAPSULE | Freq: Once | ORAL | Status: AC
  Administered 2023-01-19: 25 mg via ORAL
  Filled 2023-01-19: qty 1

## 2023-01-19 MED ORDER — SODIUM CHLORIDE 0.9 % IV SOLN
300.0000 mg | Freq: Once | INTRAVENOUS | Status: AC
  Administered 2023-01-19: 300 mg via INTRAVENOUS
  Filled 2023-01-19: qty 15

## 2023-01-19 NOTE — Progress Notes (Signed)
Diagnosis: Iron Deficiency Anemia  Provider:  Marshell Garfinkel MD  Procedure: Infusion  IV Type: Peripheral, IV Location: L Antecubital  Venofer (Iron Sucrose), Dose: 300 mg  Infusion Start Time: 10:46  Infusion Stop Time: 1225  Post Infusion IV Care: Observation period completed and Peripheral IV Discontinued  Discharge: Condition: Good, Destination: Home . AVS Declined  Performed by:  Adelina Mings, LPN

## 2023-01-21 ENCOUNTER — Encounter: Payer: Self-pay | Admitting: Physician Assistant

## 2023-01-25 ENCOUNTER — Encounter: Payer: Self-pay | Admitting: Radiology

## 2023-01-25 ENCOUNTER — Ambulatory Visit: Payer: BC Managed Care – PPO | Admitting: Radiology

## 2023-01-25 VITALS — BP 104/68

## 2023-01-25 DIAGNOSIS — N750 Cyst of Bartholin's gland: Secondary | ICD-10-CM

## 2023-01-25 DIAGNOSIS — N920 Excessive and frequent menstruation with regular cycle: Secondary | ICD-10-CM

## 2023-01-25 DIAGNOSIS — N83201 Unspecified ovarian cyst, right side: Secondary | ICD-10-CM | POA: Diagnosis not present

## 2023-01-25 MED ORDER — NORETHIN-ETH ESTRAD-FE BIPHAS 1 MG-10 MCG / 10 MCG PO TABS
1.0000 | ORAL_TABLET | Freq: Every day | ORAL | 2 refills | Status: DC
Start: 2023-01-25 — End: 2024-01-30

## 2023-01-25 NOTE — Progress Notes (Signed)
Christine Cooke May 06, 1986 [id number]   History:  37 y.o. G5P4 presents for follow up. Diagnosed with menorrhagia and ovarian cysts 04/2022. She was placed on Junel and encouraged to f/u for u/s 12 weeks later, she finished the pills and they did not help her bleeding so she did not refill. Marland Kitchen Has been bleeding through a tampon and pad within an 1 hour the first 3 days. Being treated by PCP for anemia. Dysmenorrhea for the first 3 days of cycle. Worse when she has clotting, clots push her tampons out at times. Bartholin's cyst gets bigger when she has her period.  Gynecologic History Patient's last menstrual period was 03/06/2022 (approximate).   Contraception/Family planning: tubal ligation Sexually active: yes   Obstetric History OB History  Gravida Para Term Preterm AB Living  5 4 4   1 4   SAB IAB Ectopic Multiple Live Births    1   0 4    # Outcome Date GA Lbr Len/2nd Weight Sex Delivery Anes PTL Lv  5 Term 09/27/14 [redacted]w[redacted]d  7 lb 10.1 oz (3.46 kg) M CS-LTranv Spinal  LIV  4 Term 07/03/12    M CS-LTranv Spinal  LIV  3 Term 05/01/09    F CS-LTranv Spinal  LIV  2 Term 10/29/06    F CS-LTranv EPI  LIV  1 IAB 2005             The following portions of the patient's history were reviewed and updated as appropriate: allergies, current medications, past family history, past medical history, past social history, past surgical history, and problem list.  Review of Systems Pertinent items noted in HPI and remainder of comprehensive ROS otherwise negative.   Past medical history, past surgical history, family history and social history were all reviewed and documented in the EPIC chart. Reviewed labs from last visit with PCP with patient.   Exam:  There were no vitals filed for this visit.  There is no height or weight on file to calculate BMI.  Narrative & Impression Indication: Menorrhagia, back pain   Method vaginal ultrasound   Anteverted uterus, normal size and shape No  myometrial masses   Symmetrical secretory endometrium approximately 5.32mm No obvious masses, thickening or abnormal blood flow   Both ovaries mobile with normal perfusion Right ovary enlarged with two cystic structures: 3.6 x 3.1cm simple cyst 2.1 x 1.6cm cyst with debris         Exam Ended: 05/04/22 15:02 Last Resulted: 05/04/22 15:45        Physical Exam Vitals and nursing note reviewed. Exam conducted with a chaperone present.  Constitutional:      Appearance: She is normal weight.  Genitourinary:    Vagina: Lesions present.     Cervix: No cervical motion tenderness, friability or erythema.     Uterus: Normal.      Adnexa: Right adnexa normal and left adnexa normal.     Comments: Left bartholin's gland cyst, golf ball size Neurological:     Mental Status: She is alert. Mental status is at baseline.  Psychiatric:        Mood and Affect: Mood normal.        Thought Content: Thought content normal.        Judgment: Judgment normal.       Assessment/Plan:   1. Cyst of right ovary Has not had f/u u/s, will schedule - US Transvaginal Non-OB; Future  2. Bartholin gland cyst Schedule appt for I+D  3.  Menorrhagia with regular cycle Switch to LoLoestrin to help with bleeding - Norethindrone-Ethinyl Estradiol-Fe Biphas (LO LOESTRIN FE) 1 MG-10 MCG / 10 MCG tablet; Take 1 tablet by mouth daily.  Dispense: 84 tablet; Refill: 2    Aum Caggiano B WHNP-BC 3:44 PM 05/04/2022

## 2023-01-28 ENCOUNTER — Ambulatory Visit: Payer: BC Managed Care – PPO | Admitting: *Deleted

## 2023-01-28 VITALS — BP 126/84 | HR 82 | Temp 98.3°F | Resp 18 | Ht 67.0 in | Wt 202.6 lb

## 2023-01-28 DIAGNOSIS — N920 Excessive and frequent menstruation with regular cycle: Secondary | ICD-10-CM | POA: Diagnosis not present

## 2023-01-28 DIAGNOSIS — D500 Iron deficiency anemia secondary to blood loss (chronic): Secondary | ICD-10-CM | POA: Diagnosis not present

## 2023-01-28 MED ORDER — SODIUM CHLORIDE 0.9 % IV SOLN
300.0000 mg | Freq: Once | INTRAVENOUS | Status: AC
  Administered 2023-01-28: 300 mg via INTRAVENOUS
  Filled 2023-01-28: qty 15

## 2023-01-28 MED ORDER — ACETAMINOPHEN 325 MG PO TABS: 650.0000 mg | ORAL_TABLET | Freq: Once | ORAL | Status: DC

## 2023-01-28 MED ORDER — DIPHENHYDRAMINE HCL 25 MG PO CAPS: 25.0000 mg | ORAL_CAPSULE | Freq: Once | ORAL | Status: DC

## 2023-01-28 NOTE — Progress Notes (Signed)
Diagnosis: Iron Deficiency Anemia  Provider:  Chilton Greathouse MD  Procedure: Infusion  IV Type: Peripheral, IV Location: L Antecubital  Venofer (Iron Sucrose), Dose: 300 mg  Infusion Start Time: 1045 am  Infusion Stop Time: 1240 pm  Post Infusion IV Care: Observation period completed and Peripheral IV Discontinued  Discharge: Condition: Good, Destination: Home . AVS Declined  Performed by:  Forrest Moron, RN

## 2023-02-01 ENCOUNTER — Ambulatory Visit: Payer: Medicaid Other | Admitting: Radiology

## 2023-02-04 ENCOUNTER — Ambulatory Visit (INDEPENDENT_AMBULATORY_CARE_PROVIDER_SITE_OTHER): Payer: BC Managed Care – PPO | Admitting: *Deleted

## 2023-02-04 VITALS — BP 127/85 | HR 71 | Temp 98.6°F | Resp 16 | Ht 67.0 in | Wt 202.4 lb

## 2023-02-04 DIAGNOSIS — N920 Excessive and frequent menstruation with regular cycle: Secondary | ICD-10-CM | POA: Diagnosis not present

## 2023-02-04 DIAGNOSIS — D500 Iron deficiency anemia secondary to blood loss (chronic): Secondary | ICD-10-CM | POA: Diagnosis not present

## 2023-02-04 MED ORDER — DIPHENHYDRAMINE HCL 25 MG PO CAPS: 25.0000 mg | ORAL_CAPSULE | Freq: Once | ORAL | Status: DC

## 2023-02-04 MED ORDER — SODIUM CHLORIDE 0.9 % IV SOLN
300.0000 mg | Freq: Once | INTRAVENOUS | Status: AC
  Administered 2023-02-04: 300 mg via INTRAVENOUS
  Filled 2023-02-04: qty 15

## 2023-02-04 MED ORDER — ACETAMINOPHEN 325 MG PO TABS: 650.0000 mg | ORAL_TABLET | Freq: Once | ORAL | Status: DC

## 2023-02-04 NOTE — Progress Notes (Signed)
Diagnosis: Iron Deficiency Anemia  Provider:  Chilton Greathouse MD  Procedure: Infusion  IV Type: Peripheral, IV Location: L Antecubital  Venofer (Iron Sucrose), Dose: 300 mg  Infusion Start Time: 1016  Infusion Stop Time: 1156  Post Infusion IV Care: Patient declined observation and Peripheral IV Discontinued  Discharge: Condition: Good, Destination: Home . AVS Declined  Performed by:  Evelena Peat, RN

## 2023-02-09 ENCOUNTER — Ambulatory Visit (INDEPENDENT_AMBULATORY_CARE_PROVIDER_SITE_OTHER): Payer: BC Managed Care – PPO

## 2023-02-09 ENCOUNTER — Ambulatory Visit (INDEPENDENT_AMBULATORY_CARE_PROVIDER_SITE_OTHER): Payer: BC Managed Care – PPO | Admitting: Radiology

## 2023-02-09 ENCOUNTER — Encounter: Payer: Self-pay | Admitting: Radiology

## 2023-02-09 VITALS — BP 118/84

## 2023-02-09 DIAGNOSIS — N83201 Unspecified ovarian cyst, right side: Secondary | ICD-10-CM | POA: Diagnosis not present

## 2023-02-09 NOTE — Progress Notes (Signed)
   Christine Cooke 12/05/1985 [id number]   History:  37 y.o. G5P4 presents for ultrasound to evaluate resolution of ovarian cysts.  She has had worsening menorrhagia and back pain. Has been bleeding through a tampon and pad within an 1 hour the first 3 days. Being treated by PCP for anemia. Known hx of 'small' fibroids and ovarian cysts when pregnant. She has had a tubal ligation. Dysmenorrhea for the first 3 days of cycle. Worse when she has clotting, clots push her tampons out at times. Was started on LoLoestrin 2 weeks ago and flow is already impoving as is pain.  Gynecologic History Patient's last menstrual period was 03/06/2022 (approximate).   Contraception/Family planning: tubal ligation Sexually active: yes   Obstetric History OB History  Gravida Para Term Preterm AB Living  SAB IAB Ectopic Multiple Live Births    1   0 4    # Outcome Date GA Lbr Len/2nd Weight Sex Delivery Anes PTL Lv  5 Term 09/27/14 [redacted]w[redacted]d  7 lb 10.1 oz (3.46 kg) M CS-LTranv Spinal  LIV  4 Term 07/03/12    M CS-LTranv Spinal  LIV  3 Term 05/01/09    F CS-LTranv Spinal  LIV  2 Term 10/29/06    F CS-LTranv EPI  LIV  1 IAB 2005             The following portions of the patient's history were reviewed and updated as appropriate: allergies, current medications, past family history, past medical history, past social history, past surgical history, and problem list.  Review of Systems Pertinent items noted in HPI and remainder of comprehensive ROS otherwise negative.   Past medical history, past surgical history, family history and social history were all reviewed and documented in the EPIC chart. Reviewed labs from last visit with PCP with patient.   Exam:  There were no vitals filed for this visit.  There is no height or weight on file to calculate BMI.  Narrative & Impression Indication: right ovarian cyst, compared to u/s images 04/2022   Method vaginal ultrasound   Anteverted  uterus, normal size and shape No myometrial masses   Symmetrical  endometrium approximately 6.47mm No obvious masses, thickening or abnormal blood flow   Both ovaries mobile with normal perfusion Right ovary enlarged with two cystic structures: 3.7 x 2.85cm simple cyst 1.40 x 0.82cm cyst with debris   Impression: right ovarian cysts, resolving         Exam Ended: 02/09/23 11:04 Last Resulted: 02/09/23 11:14         Assessment/Plan:   1. Cyst of right ovary Reassured slightly decreasing in size Continue OCPs (just started 2 weeks ago) to improve cysts Warning signs reviewed     Arlie Solomons B WHNP-BC 3:44 PM 05/04/2022

## 2023-02-23 NOTE — Progress Notes (Deleted)
GYNECOLOGY  VISIT   HPI: 37 y.o.   Single  African American  female   [phone] with Patient's last menstrual period was 02/08/2023 (exact date).   here for   I&D Bartholin Cyst  GYNECOLOGIC HISTORY: Patient's last menstrual period was 02/08/2023 (exact date). Contraception:  BTL Menopausal hormone therapy:  n/a Last mammogram:  n/a Last pap smear:   05/30/14 neg        OB History     Gravida  5   Para  4   Term  4   Preterm      AB  1   Living  4      SAB      IAB  1   Ectopic      Multiple  0   Live Births  4              Patient Active Problem List   Diagnosis Date Noted   Iron deficiency anemia 01/11/2023   GAD (generalized anxiety disorder) 12/20/2022   Depression, major, single episode, moderate (HCC) 12/20/2022   Delivered by cesarean section 09/29/2014   Abdominal pain in pregnancy, antepartum 08/25/2014   Supervision of normal pregnancy 05/30/2014   History of cesarean delivery, currently pregnant 05/30/2014   Marijuana use 05/30/2014   Rubella non-immune status, antepartum 05/27/2014    Past Medical History:  Diagnosis Date   Anemia    GERD (gastroesophageal reflux disease)    with pregnancy   Infection    UTI   Mood swings    Ovarian cyst     Past Surgical History:  Procedure Laterality Date   CESAREAN SECTION     CESAREAN SECTION WITH BILATERAL TUBAL LIGATION  09/27/2014   Procedure: CESAREAN SECTION WITH BILATERAL TUBAL LIGATION;  Surgeon: Catalina Antigua, MD;  Location: [facility];  Service: Obstetrics;;   INDUCED ABORTION     TUBAL LIGATION      Current Outpatient Medications  Medication Sig Dispense Refill   FeFum-FePoly-FA-B Cmp-C-Biot (INTEGRA PLUS) CAPS Take 1 capsule by mouth every morning. (Patient not taking: Reported on 01/25/2023) 30 capsule 2   folic acid (FOLVITE) 1 MG tablet Take 1 tablet (1 mg total) by mouth daily. 30 tablet 2   Norethindrone-Ethinyl Estradiol-Fe Biphas (LO LOESTRIN FE) 1 MG-10 MCG / 10 MCG tablet  Take 1 tablet by mouth daily. 84 tablet 2   sertraline (ZOLOFT) 50 MG tablet Take 1 tablet (50 mg total) by mouth daily. Take 1/2 tab daily for first 2 weeks. (Patient not taking: Reported on 01/25/2023) 30 tablet 3   No current facility-administered medications for this visit.     ALLERGIES: Patient has no known allergies.  Family History  Problem Relation Age of Onset   Hypertension Mother    Sickle cell anemia Sister    Asthma Sister    Diabetes Maternal Aunt    Hypertension Maternal Aunt    Endometrial cancer Paternal Aunt    Stroke Maternal Grandmother    Hypertension Maternal Grandmother    Hyperlipidemia Maternal Grandmother    Heart disease Maternal Grandmother    Diabetes Maternal Grandmother    Asthma Son     Social History   Socioeconomic History   Marital status: Single    Spouse name: Not on file   Number of children: Not on file   Years of education: Not on file   Highest education level: Not on file  Occupational History   Not on file  Tobacco Use   Smoking status:  Former    Packs/day: 0.25    Years: 2.00    Additional pack years: 0.00    Total pack years: 0.50    Types: Cigarettes, E-cigarettes    Quit date: 09/25/2009    Years since quitting: 13.4   Smokeless tobacco: Never   Tobacco comments:    VAPES  Vaping Use   Vaping Use: Never used  Substance and Sexual Activity   Alcohol use: Yes    Comment: weekends   Drug use: Yes    Types: Marijuana   Sexual activity: Yes    Partners: Male    Birth control/protection: Surgical    Comment: tubal  Other Topics Concern   Not on file  Social History Narrative   Not on file   Social Determinants of Health   Financial Resource Strain: Not on file  Food Insecurity: Not on file  Transportation Needs: Not on file  Physical Activity: Not on file  Stress: Not on file  Social Connections: Not on file  Intimate Partner Violence: Not on file    Review of Systems  PHYSICAL EXAMINATION:    LMP  02/08/2023 (Exact Date)     General appearance: alert, cooperative and appears stated age Head: Normocephalic, without obvious abnormality, atraumatic Neck: no adenopathy, supple, symmetrical, trachea midline and thyroid normal to inspection and palpation Lungs: clear to auscultation bilaterally Breasts: normal appearance, no masses or tenderness, No nipple retraction or dimpling, No nipple discharge or bleeding, No axillary or supraclavicular adenopathy Heart: regular rate and rhythm Abdomen: soft, non-tender, no masses,  no organomegaly Extremities: extremities normal, atraumatic, no cyanosis or edema Skin: Skin color, texture, turgor normal. No rashes or lesions Lymph nodes: Cervical, supraclavicular, and axillary nodes normal. No abnormal inguinal nodes palpated Neurologic: Grossly normal  Pelvic: External genitalia:  no lesions              Urethra:  normal appearing urethra with no masses, tenderness or lesions              Bartholins and Skenes: normal                 Vagina: normal appearing vagina with normal color and discharge, no lesions              Cervix: no lesions                Bimanual Exam:  Uterus:  normal size, contour, position, consistency, mobility, non-tender              Adnexa: no mass, fullness, tenderness              Rectal exam: {yes no:[id number]}.  Confirms.              Anus:  normal sphincter tone, no lesions  Chaperone was present for exam:  ***  ASSESSMENT     PLAN     An After Visit Summary was printed and given to the patient.  ______ minutes face to face time of which over 50% was spent in counseling.

## 2023-03-09 ENCOUNTER — Ambulatory Visit: Payer: Medicaid Other | Admitting: Obstetrics and Gynecology

## 2023-03-15 ENCOUNTER — Encounter: Payer: Self-pay | Admitting: Physician Assistant

## 2023-03-29 NOTE — Progress Notes (Deleted)
GYNECOLOGY  VISIT   HPI: 37 y.o.   Single  African American  female   [phone] with No LMP recorded.   here for   I and D Bartholin Gland Cyst  GYNECOLOGIC HISTORY: No LMP recorded. Contraception:  BTL Menopausal hormone therapy:  n/a Last mammogram:  n/a Last pap smear:   06/14/17        OB History     Gravida  5   Para  4   Term  4   Preterm      AB  1   Living  4      SAB      IAB  1   Ectopic      Multiple  0   Live Births  4              Patient Active Problem List   Diagnosis Date Noted   Iron deficiency anemia 01/11/2023   GAD (generalized anxiety disorder) 12/20/2022   Depression, major, single episode, moderate (HCC) 12/20/2022   Delivered by cesarean section 09/29/2014   Abdominal pain in pregnancy, antepartum 08/25/2014   Supervision of normal pregnancy 05/30/2014   History of cesarean delivery, currently pregnant 05/30/2014   Marijuana use 05/30/2014   Rubella non-immune status, antepartum 05/27/2014    Past Medical History:  Diagnosis Date   Anemia    GERD (gastroesophageal reflux disease)    with pregnancy   Infection    UTI   Mood swings    Ovarian cyst     Past Surgical History:  Procedure Laterality Date   CESAREAN SECTION     CESAREAN SECTION WITH BILATERAL TUBAL LIGATION  09/27/2014   Procedure: CESAREAN SECTION WITH BILATERAL TUBAL LIGATION;  Surgeon: Catalina Antigua, MD;  Location: [facility];  Service: Obstetrics;;   INDUCED ABORTION     TUBAL LIGATION      Current Outpatient Medications  Medication Sig Dispense Refill   FeFum-FePoly-FA-B Cmp-C-Biot (INTEGRA PLUS) CAPS Take 1 capsule by mouth every morning. (Patient not taking: Reported on 01/25/2023) 30 capsule 2   folic acid (FOLVITE) 1 MG tablet Take 1 tablet (1 mg total) by mouth daily. 30 tablet 2   Norethindrone-Ethinyl Estradiol-Fe Biphas (LO LOESTRIN FE) 1 MG-10 MCG / 10 MCG tablet Take 1 tablet by mouth daily. 84 tablet 2   sertraline (ZOLOFT) 50 MG tablet Take 1  tablet (50 mg total) by mouth daily. Take 1/2 tab daily for first 2 weeks. (Patient not taking: Reported on 01/25/2023) 30 tablet 3   No current facility-administered medications for this visit.     ALLERGIES: Patient has no known allergies.  Family History  Problem Relation Age of Onset   Hypertension Mother    Sickle cell anemia Sister    Asthma Sister    Diabetes Maternal Aunt    Hypertension Maternal Aunt    Endometrial cancer Paternal Aunt    Stroke Maternal Grandmother    Hypertension Maternal Grandmother    Hyperlipidemia Maternal Grandmother    Heart disease Maternal Grandmother    Diabetes Maternal Grandmother    Asthma Son     Social History   Socioeconomic History   Marital status: Single    Spouse name: Not on file   Number of children: Not on file   Years of education: Not on file   Highest education level: Not on file  Occupational History   Not on file  Tobacco Use   Smoking status: Former    Packs/day: 0.25  Years: 2.00    Additional pack years: 0.00    Total pack years: 0.50    Types: Cigarettes, E-cigarettes    Quit date: 09/25/2009    Years since quitting: 13.5   Smokeless tobacco: Never   Tobacco comments:    VAPES  Vaping Use   Vaping Use: Never used  Substance and Sexual Activity   Alcohol use: Yes    Comment: weekends   Drug use: Yes    Types: Marijuana   Sexual activity: Yes    Partners: Male    Birth control/protection: Surgical    Comment: tubal  Other Topics Concern   Not on file  Social History Narrative   Not on file   Social Determinants of Health   Financial Resource Strain: Not on file  Food Insecurity: Not on file  Transportation Needs: Not on file  Physical Activity: Not on file  Stress: Not on file  Social Connections: Not on file  Intimate Partner Violence: Not on file    Review of Systems  PHYSICAL EXAMINATION:    There were no vitals taken for this visit.    General appearance: alert, cooperative and  appears stated age Head: Normocephalic, without obvious abnormality, atraumatic Neck: no adenopathy, supple, symmetrical, trachea midline and thyroid normal to inspection and palpation Lungs: clear to auscultation bilaterally Breasts: normal appearance, no masses or tenderness, No nipple retraction or dimpling, No nipple discharge or bleeding, No axillary or supraclavicular adenopathy Heart: regular rate and rhythm Abdomen: soft, non-tender, no masses,  no organomegaly Extremities: extremities normal, atraumatic, no cyanosis or edema Skin: Skin color, texture, turgor normal. No rashes or lesions Lymph nodes: Cervical, supraclavicular, and axillary nodes normal. No abnormal inguinal nodes palpated Neurologic: Grossly normal  Pelvic: External genitalia:  no lesions              Urethra:  normal appearing urethra with no masses, tenderness or lesions              Bartholins and Skenes: normal                 Vagina: normal appearing vagina with normal color and discharge, no lesions              Cervix: no lesions                Bimanual Exam:  Uterus:  normal size, contour, position, consistency, mobility, non-tender              Adnexa: no mass, fullness, tenderness              Rectal exam: {yes no:314532}.  Confirms.              Anus:  normal sphincter tone, no lesions  Chaperone was present for exam:  ***  ASSESSMENT     PLAN     An After Visit Summary was printed and given to the patient.  ______ minutes face to face time of which over 50% was spent in counseling.

## 2023-04-03 NOTE — Progress Notes (Deleted)
[facility] OFFICE PROGRESS NOTE  Sharlene Dory, DO [address] [place] [address]  DIAGNOSIS: Iron deficiency anemia.   PRIOR THERAPY: None   CURRENT THERAPY: 1) IV iron with Venofer 300 mg weekly x 3.  Most recent dose on 02/04/2023 2) Integra +1 tablet p.o. daily  INTERVAL HISTORY: Christine Cooke 37 y.o. female returns to clinic today for follow-up visit.  She establish care on 01/11/2023 for iron deficiency anemia.  At that time, she was found to have low iron and low folic acid.  She is currently taking an iron supplement with Integra +1 tablet p.o. daily and is ***compliant?  With this.  She is also taking folic acid 1 tablet p.o. daily.  She received IV iron with Venofer weekly x 3 the most recent dose on 02/04/2023.  She tolerated this well.  In the interval since last being seen, she ***GYN?  The patient reports heavy menstrual cycles for which she has to wear depends.  She denied the control pills but this did not lighten up her cycles.  She previously had a pelvic ultrasound last year which did not show any fibroids.  Gynecology on 02/05/2023 switch to different [facility] to help with bleeding  Regarding her symptoms of anemia, her fatigue has ***.  Dyspnea on exertion?  Cold intolerance?  Ice chips?  She denies any other significant abnormal bleeding or bruising.  She is here today for evaluation and review blood work.  MEDICAL HISTORY: Past Medical History:  Diagnosis Date   Anemia    GERD (gastroesophageal reflux disease)    with pregnancy   Infection    UTI   Mood swings    Ovarian cyst     ALLERGIES:  has No Known Allergies.  MEDICATIONS:  Current Outpatient Medications  Medication Sig Dispense Refill   FeFum-FePoly-FA-B Cmp-C-Biot (INTEGRA PLUS) CAPS Take 1 capsule by mouth every morning. (Patient not taking: Reported on 01/25/2023) 30 capsule 2   folic acid (FOLVITE) 1 MG tablet Take 1 tablet (1 mg total) by mouth daily.  30 tablet 2   Norethindrone-Ethinyl Estradiol-Fe Biphas (LO LOESTRIN FE) 1 MG-10 MCG / 10 MCG tablet Take 1 tablet by mouth daily. 84 tablet 2   sertraline (ZOLOFT) 50 MG tablet Take 1 tablet (50 mg total) by mouth daily. Take 1/2 tab daily for first 2 weeks. (Patient not taking: Reported on 01/25/2023) 30 tablet 3   No current facility-administered medications for this visit.    SURGICAL HISTORY:  Past Surgical History:  Procedure Laterality Date   CESAREAN SECTION     CESAREAN SECTION WITH BILATERAL TUBAL LIGATION  09/27/2014   Procedure: CESAREAN SECTION WITH BILATERAL TUBAL LIGATION;  Surgeon: Catalina Antigua, MD;  Location: [facility];  Service: Obstetrics;;   INDUCED ABORTION     TUBAL LIGATION      REVIEW OF SYSTEMS:   Review of Systems  Constitutional: Negative for appetite change, chills, fatigue, fever and unexpected weight change.  HENT:   Negative for mouth sores, nosebleeds, sore throat and trouble swallowing.   Eyes: Negative for eye problems and icterus.  Respiratory: Negative for cough, hemoptysis, shortness of breath and wheezing.   Cardiovascular: Negative for chest pain and leg swelling.  Gastrointestinal: Negative for abdominal pain, constipation, diarrhea, nausea and vomiting.  Genitourinary: Negative for bladder incontinence, difficulty urinating, dysuria, frequency and hematuria.   Musculoskeletal: Negative for back pain, gait problem, neck pain and neck stiffness.  Skin: Negative for itching  and rash.  Neurological: Negative for dizziness, extremity weakness, gait problem, headaches, light-headedness and seizures.  Hematological: Negative for adenopathy. Does not bruise/bleed easily.  Psychiatric/Behavioral: Negative for confusion, depression and sleep disturbance. The patient is not nervous/anxious.     PHYSICAL EXAMINATION:  There were no vitals taken for this visit.  ECOG PERFORMANCE STATUS: {CHL ONC ECOG [id number]  Physical Exam  Constitutional:  Oriented to person, place, and time and well-developed, well-nourished, and in no distress. No distress.  HENT:  Head: Normocephalic and atraumatic.  Mouth/Throat: Oropharynx is clear and moist. No oropharyngeal exudate.  Eyes: Conjunctivae are normal. Right eye exhibits no discharge. Left eye exhibits no discharge. No scleral icterus.  Neck: Normal range of motion. Neck supple.  Cardiovascular: Normal rate, regular rhythm, normal heart sounds and intact distal pulses.   Pulmonary/Chest: Effort normal and breath sounds normal. No respiratory distress. No wheezes. No rales.  Abdominal: Soft. Bowel sounds are normal. Exhibits no distension and no mass. There is no tenderness.  Musculoskeletal: Normal range of motion. Exhibits no edema.  Lymphadenopathy:    No cervical adenopathy.  Neurological: Alert and oriented to person, place, and time. Exhibits normal muscle tone. Gait normal. Coordination normal.  Skin: Skin is warm and dry. No rash noted. Not diaphoretic. No erythema. No pallor.  Psychiatric: Mood, memory and judgment normal.  Vitals reviewed.  LABORATORY DATA: Lab Results  Component Value Date   WBC 9.2 01/11/2023   HGB 10.6 (L) 01/11/2023   HCT 33.1 (L) 01/11/2023   MCV 84.0 01/11/2023   PLT 247 01/11/2023      Chemistry      Component Value Date/Time   NA 140 01/11/2023 1309   K 3.8 01/11/2023 1309   CL 109 01/11/2023 1309   CO2 25 01/11/2023 1309   BUN 12 01/11/2023 1309   CREATININE 0.84 01/11/2023 1309      Component Value Date/Time   CALCIUM 9.1 01/11/2023 1309   ALKPHOS 64 01/11/2023 1309   AST 20 01/11/2023 1309   ALT 16 01/11/2023 1309   BILITOT 0.5 01/11/2023 1309       RADIOGRAPHIC STUDIES:  No results found.   ASSESSMENT/PLAN:  This is a very pleasant 37 year old African-American female referred to the clinic for iron deficiency anemia.    The patient is currently taking folic acid and iron supplements with Integra plus.  She also receives IV  iron with Venofer 300 mg x 3 weekly the most recent dose on 02/04/2023.  The patient repeat lab work performed today which showed ***.  Her iron studies are pending at this time.  If her iron studies continue to show significant iron deficiency we will arrange for additional IV iron infusions.  In the meantime, she will continue taking her Integra plus and folic acid.  She is following with GYN who has recently switched her birth control supplements to help with her menstrual cycles which has ***effective?  We will see her back for follow-up in 3 months for evaluation repeat blood work.  The patient was advised to call immediately if she has any concerning symptoms in the interval. The patient voices understanding of current disease status and treatment options and is in agreement with the current care plan. All questions were answered. The patient knows to call the clinic with any problems, questions or concerns. We can certainly see the patient much sooner if necessary       No orders of the defined types were placed in this encounter.  I spent {CHL ONC TIME VISIT - WJXBJ:[id number]} counseling the patient face to face. The total time spent in the appointment was {CHL ONC TIME VISIT - YQMVH:[id number]}.  Travanti Mcmanus L Ramez Arrona, PA-C 04/03/23

## 2023-04-06 ENCOUNTER — Inpatient Hospital Stay: Payer: Medicaid Other | Attending: Physician Assistant

## 2023-04-06 ENCOUNTER — Inpatient Hospital Stay: Payer: Medicaid Other | Admitting: Physician Assistant

## 2023-04-07 ENCOUNTER — Ambulatory Visit: Payer: Medicaid Other | Admitting: Radiology

## 2023-04-07 ENCOUNTER — Telehealth: Payer: Self-pay | Admitting: Physician Assistant

## 2023-04-07 NOTE — Telephone Encounter (Signed)
Patient aware of upcoming visit

## 2023-04-12 ENCOUNTER — Ambulatory Visit: Payer: Medicaid Other | Admitting: Obstetrics and Gynecology

## 2023-04-13 ENCOUNTER — Other Ambulatory Visit: Payer: Medicaid Other

## 2023-04-13 ENCOUNTER — Ambulatory Visit: Payer: Medicaid Other | Admitting: Physician Assistant

## 2023-05-31 NOTE — Progress Notes (Deleted)
GYNECOLOGY  VISIT   HPI: 37 y.o.   Single  African American  female   [id number] with No LMP recorded.   here for   I and D for Bartholin  GYNECOLOGIC HISTORY: No LMP recorded. Contraception:  BTL Menopausal hormone therapy:  n/a Last mammogram:  n/a Last pap smear:   05/30/14 WNL        OB History     Gravida  5   Para  4   Term  4   Preterm      AB  1   Living  4      SAB      IAB  1   Ectopic      Multiple  0   Live Births  4              Patient Active Problem List   Diagnosis Date Noted   Iron deficiency anemia 01/11/2023   GAD (generalized anxiety disorder) 12/20/2022   Depression, major, single episode, moderate (HCC) 12/20/2022   Delivered by cesarean section 09/29/2014   Abdominal pain in pregnancy, antepartum 08/25/2014   Supervision of normal pregnancy 05/30/2014   History of cesarean delivery, currently pregnant 05/30/2014   Marijuana use 05/30/2014   Rubella non-immune status, antepartum 05/27/2014    Past Medical History:  Diagnosis Date   Anemia    GERD (gastroesophageal reflux disease)    with pregnancy   Infection    UTI   Mood swings    Ovarian cyst     Past Surgical History:  Procedure Laterality Date   CESAREAN SECTION     CESAREAN SECTION WITH BILATERAL TUBAL LIGATION  09/27/2014   Procedure: CESAREAN SECTION WITH BILATERAL TUBAL LIGATION;  Surgeon: Catalina Antigua, MD;  Location: [facility];  Service: Obstetrics;;   INDUCED ABORTION     TUBAL LIGATION      Current Outpatient Medications  Medication Sig Dispense Refill   FeFum-FePoly-FA-B Cmp-C-Biot (INTEGRA PLUS) CAPS Take 1 capsule by mouth every morning. (Patient not taking: Reported on 01/25/2023) 30 capsule 2   folic acid (FOLVITE) 1 MG tablet Take 1 tablet (1 mg total) by mouth daily. 30 tablet 2   Norethindrone-Ethinyl Estradiol-Fe Biphas (LO LOESTRIN FE) 1 MG-10 MCG / 10 MCG tablet Take 1 tablet by mouth daily. 84 tablet 2   sertraline (ZOLOFT) 50 MG tablet Take 1  tablet (50 mg total) by mouth daily. Take 1/2 tab daily for first 2 weeks. (Patient not taking: Reported on 01/25/2023) 30 tablet 3   No current facility-administered medications for this visit.     ALLERGIES: Patient has no known allergies.  Family History  Problem Relation Age of Onset   Hypertension Mother    Sickle cell anemia Sister    Asthma Sister    Diabetes Maternal Aunt    Hypertension Maternal Aunt    Endometrial cancer Paternal Aunt    Stroke Maternal Grandmother    Hypertension Maternal Grandmother    Hyperlipidemia Maternal Grandmother    Heart disease Maternal Grandmother    Diabetes Maternal Grandmother    Asthma Son     Social History   Socioeconomic History   Marital status: Single    Spouse name: Not on file   Number of children: Not on file   Years of education: Not on file   Highest education level: Not on file  Occupational History   Not on file  Tobacco Use   Smoking status: Former    Current packs/day: 0.00  Average packs/day: 0.3 packs/day for 2.0 years (0.5 ttl pk-yrs)    Types: Cigarettes, E-cigarettes    Start date: 09/26/2007    Quit date: 09/25/2009    Years since quitting: 13.6   Smokeless tobacco: Never   Tobacco comments:    VAPES  Vaping Use   Vaping status: Never Used  Substance and Sexual Activity   Alcohol use: Yes    Comment: weekends   Drug use: Yes    Types: Marijuana   Sexual activity: Yes    Partners: Male    Birth control/protection: Surgical    Comment: tubal  Other Topics Concern   Not on file  Social History Narrative   Not on file   Social Determinants of Health   Financial Resource Strain: Not on file  Food Insecurity: Not on file  Transportation Needs: Not on file  Physical Activity: Not on file  Stress: Not on file  Social Connections: Not on file  Intimate Partner Violence: Not on file    Review of Systems  PHYSICAL EXAMINATION:    There were no vitals taken for this visit.    General  appearance: alert, cooperative and appears stated age Head: Normocephalic, without obvious abnormality, atraumatic Neck: no adenopathy, supple, symmetrical, trachea midline and thyroid normal to inspection and palpation Lungs: clear to auscultation bilaterally Breasts: normal appearance, no masses or tenderness, No nipple retraction or dimpling, No nipple discharge or bleeding, No axillary or supraclavicular adenopathy Heart: regular rate and rhythm Abdomen: soft, non-tender, no masses,  no organomegaly Extremities: extremities normal, atraumatic, no cyanosis or edema Skin: Skin color, texture, turgor normal. No rashes or lesions Lymph nodes: Cervical, supraclavicular, and axillary nodes normal. No abnormal inguinal nodes palpated Neurologic: Grossly normal  Pelvic: External genitalia:  no lesions              Urethra:  normal appearing urethra with no masses, tenderness or lesions              Bartholins and Skenes: normal                 Vagina: normal appearing vagina with normal color and discharge, no lesions              Cervix: no lesions                Bimanual Exam:  Uterus:  normal size, contour, position, consistency, mobility, non-tender              Adnexa: no mass, fullness, tenderness              Rectal exam: {yes no:314532}.  Confirms.              Anus:  normal sphincter tone, no lesions  Chaperone was present for exam:  ***  ASSESSMENT     PLAN     An After Visit Summary was printed and given to the patient.  ______ minutes face to face time of which over 50% was spent in counseling.

## 2023-06-14 ENCOUNTER — Ambulatory Visit: Payer: Medicaid Other | Admitting: Obstetrics and Gynecology

## 2023-06-28 NOTE — Progress Notes (Deleted)
[facility] OFFICE PROGRESS NOTE  Sharlene Dory, DO [address] [place] [address]  DIAGNOSIS: iron deficiency anemia and folic acid deficiency  PRIOR THERAPY: None  CURRENT THERAPY:  1) Iron supplement with***if taking Integra plus versus over-the-counter versus prenatal, slow release iron, or liquid iron 2) folic acid 1 mg p.o. daily  3) IV iron as needed with Venofer 300 mg, most recent on 02/04/2023  INTERVAL HISTORY: Christine Cooke 37 y.o. female returns to the clinic today for follow-up visit.  She establish care in the clinic on 01/11/23.  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  She is**compliant with her folic acid supplement.  When she was seen in March 2024, she stopped taking her iron supplement at that time due to it causing constipation.  We discussed alternative options such as Integra plus, slow release iron, liquid iron, or prenatal iron.  She opted to ***.   We arrange for IV iron infusions with Venofer 300 mg.  The most recent dose was given on 01/29/2023.  She tolerated the iron infusions well.  After the iron infusions, she felt ****   Regarding symptoms, she feels ***today.  Fatigue, dyspnea exertion, cold intolerance, craving ice chips.  Denies any abnormal bleeding or bruising.  She may have mild blood-tinged mucus from her nose but denies any overt epistaxis.  She has heavy menstrual cycles every 4 weeks lasting 7 days.  She needs to wear depends and changes super tampons every 30 to 45 minutes and she reports associated clots.  She saw her GYN on ****she reports she previously was put on birth control pills but it did not lighten her periods.  It looks like she saw gynecology on 02/09/2023 for following up on her menorrhagia.  She had started and switched her treatment to a different birth control which has ***.        MEDICAL HISTORY: Past Medical History:   Diagnosis Date   Anemia    GERD (gastroesophageal reflux disease)    with pregnancy   Infection    UTI   Mood swings    Ovarian cyst     ALLERGIES:  has No Known Allergies.  MEDICATIONS:  Current Outpatient Medications  Medication Sig Dispense Refill   FeFum-FePoly-FA-B Cmp-C-Biot (INTEGRA PLUS) CAPS Take 1 capsule by mouth every morning. (Patient not taking: Reported on 01/25/2023) 30 capsule 2   folic acid (FOLVITE) 1 MG tablet Take 1 tablet (1 mg total) by mouth daily. 30 tablet 2   Norethindrone-Ethinyl Estradiol-Fe Biphas (LO LOESTRIN FE) 1 MG-10 MCG / 10 MCG tablet Take 1 tablet by mouth daily. 84 tablet 2   sertraline (ZOLOFT) 50 MG tablet Take 1 tablet (50 mg total) by mouth daily. Take 1/2 tab daily for first 2 weeks. (Patient not taking: Reported on 01/25/2023) 30 tablet 3   No current facility-administered medications for this visit.    SURGICAL HISTORY:  Past Surgical History:  Procedure Laterality Date   CESAREAN SECTION     CESAREAN SECTION WITH BILATERAL TUBAL LIGATION  09/27/2014   Procedure: CESAREAN SECTION WITH BILATERAL TUBAL LIGATION;  Surgeon: Catalina Antigua, MD;  Location: [facility];  Service: Obstetrics;;   INDUCED ABORTION     TUBAL LIGATION      REVIEW OF SYSTEMS:   Review of Systems  Constitutional: Negative for appetite change, chills, fatigue, fever and unexpected weight change.  HENT:  Negative for mouth sores, nosebleeds, sore throat and trouble swallowing.   Eyes: Negative for eye problems and icterus.  Respiratory: Negative for cough, hemoptysis, shortness of breath and wheezing.   Cardiovascular: Negative for chest pain and leg swelling.  Gastrointestinal: Negative for abdominal pain, constipation, diarrhea, nausea and vomiting.  Genitourinary: Negative for bladder incontinence, difficulty urinating, dysuria, frequency and hematuria.   Musculoskeletal: Negative for back pain, gait problem, neck pain and neck stiffness.  Skin: Negative for  itching and rash.  Neurological: Negative for dizziness, extremity weakness, gait problem, headaches, light-headedness and seizures.  Hematological: Negative for adenopathy. Does not bruise/bleed easily.  Psychiatric/Behavioral: Negative for confusion, depression and sleep disturbance. The patient is not nervous/anxious.     PHYSICAL EXAMINATION:  There were no vitals taken for this visit.  ECOG PERFORMANCE STATUS: {CHL ONC ECOG [id number]  Physical Exam  Constitutional: Oriented to person, place, and time and well-developed, well-nourished, and in no distress. No distress.  HENT:  Head: Normocephalic and atraumatic.  Mouth/Throat: Oropharynx is clear and moist. No oropharyngeal exudate.  Eyes: Conjunctivae are normal. Right eye exhibits no discharge. Left eye exhibits no discharge. No scleral icterus.  Neck: Normal range of motion. Neck supple.  Cardiovascular: Normal rate, regular rhythm, normal heart sounds and intact distal pulses.   Pulmonary/Chest: Effort normal and breath sounds normal. No respiratory distress. No wheezes. No rales.  Abdominal: Soft. Bowel sounds are normal. Exhibits no distension and no mass. There is no tenderness.  Musculoskeletal: Normal range of motion. Exhibits no edema.  Lymphadenopathy:    No cervical adenopathy.  Neurological: Alert and oriented to person, place, and time. Exhibits normal muscle tone. Gait normal. Coordination normal.  Skin: Skin is warm and dry. No rash noted. Not diaphoretic. No erythema. No pallor.  Psychiatric: Mood, memory and judgment normal.  Vitals reviewed.  LABORATORY DATA: Lab Results  Component Value Date   WBC 9.2 01/11/2023   HGB 10.6 (L) 01/11/2023   HCT 33.1 (L) 01/11/2023   MCV 84.0 01/11/2023   PLT 247 01/11/2023      Chemistry      Component Value Date/Time   NA 140 01/11/2023 1309   K 3.8 01/11/2023 1309   CL 109 01/11/2023 1309   CO2 25 01/11/2023 1309   BUN 12 01/11/2023 1309   CREATININE 0.84  01/11/2023 1309      Component Value Date/Time   CALCIUM 9.1 01/11/2023 1309   ALKPHOS 64 01/11/2023 1309   AST 20 01/11/2023 1309   ALT 16 01/11/2023 1309   BILITOT 0.5 01/11/2023 1309       RADIOGRAPHIC STUDIES:  No results found.   ASSESSMENT/PLAN:  This is a very pleasant 37 year old African-American female in in the clinic for iron deficiency anemia and folic acid deficienc.    She is taking folic acid p.o. daily.  She also takes  ***iron?  She receives IV iron on an as needed basis her most recent being in April 2024.  The patient had a repeat CBC, CMP, iron studies, ferritin drawn today.  She also repeat folate.  Her labs show  ***  Her iron studies are pending.  If this continues to show significant iron deficiency I will arrange for IV iron with Venofer at the [facility] infusion center.  We will see her back for labs and follow-up visit in 3 months.  She will continue to follow with GYN regarding her menorrhagia.  Continue supplements?  The patient was advised to call immediately if she has  any concerning symptoms in the interval. The patient voices understanding of current disease status and treatment options and is in agreement with the current care plan. All questions were answered. The patient knows to call the clinic with any problems, questions or concerns. We can certainly see the patient much sooner if necessary     No orders of the defined types were placed in this encounter.    I spent {CHL ONC TIME VISIT - ZOXWR:[id number]} counseling the patient face to face. The total time spent in the appointment was {CHL ONC TIME VISIT - BJYNW:[id number]}.  Temeca Somma L Ebony Yorio, PA-C 06/28/23

## 2023-07-01 ENCOUNTER — Inpatient Hospital Stay: Payer: Medicaid Other | Admitting: Physician Assistant

## 2023-07-01 ENCOUNTER — Other Ambulatory Visit: Payer: Self-pay | Admitting: Physician Assistant

## 2023-07-01 ENCOUNTER — Inpatient Hospital Stay: Payer: Medicaid Other | Attending: Physician Assistant

## 2023-07-05 ENCOUNTER — Ambulatory Visit: Payer: Medicaid Other | Admitting: Physician Assistant

## 2023-07-05 ENCOUNTER — Other Ambulatory Visit: Payer: Medicaid Other

## 2023-07-14 NOTE — Progress Notes (Deleted)
GYNECOLOGY  VISIT   HPI: 37 y.o.   Single  African American  female   [phone] with No LMP recorded.   here for   I and D for Bartholin  GYNECOLOGIC HISTORY: No LMP recorded. Contraception:  BTL Menopausal hormone therapy:  n/a Last mammogram:  n/a Last pap smear:   05/30/14 WNL        OB History     Gravida  5   Para  4   Term  4   Preterm      AB  1   Living  4      SAB      IAB  1   Ectopic      Multiple  0   Live Births  4              Patient Active Problem List   Diagnosis Date Noted   Iron deficiency anemia 01/11/2023   GAD (generalized anxiety disorder) 12/20/2022   Depression, major, single episode, moderate (HCC) 12/20/2022   Delivered by cesarean section 09/29/2014   Abdominal pain in pregnancy, antepartum 08/25/2014   Supervision of normal pregnancy 05/30/2014   History of cesarean delivery, currently pregnant 05/30/2014   Marijuana use 05/30/2014   Rubella non-immune status, antepartum 05/27/2014    Past Medical History:  Diagnosis Date   Anemia    GERD (gastroesophageal reflux disease)    with pregnancy   Infection    UTI   Mood swings    Ovarian cyst     Past Surgical History:  Procedure Laterality Date   CESAREAN SECTION     CESAREAN SECTION WITH BILATERAL TUBAL LIGATION  09/27/2014   Procedure: CESAREAN SECTION WITH BILATERAL TUBAL LIGATION;  Surgeon: Catalina Antigua, MD;  Location: [facility];  Service: Obstetrics;;   INDUCED ABORTION     TUBAL LIGATION      Current Outpatient Medications  Medication Sig Dispense Refill   FeFum-FePoly-FA-B Cmp-C-Biot (INTEGRA PLUS) CAPS Take 1 capsule by mouth every morning. (Patient not taking: Reported on 01/25/2023) 30 capsule 2   folic acid (FOLVITE) 1 MG tablet Take 1 tablet (1 mg total) by mouth daily. 30 tablet 2   Norethindrone-Ethinyl Estradiol-Fe Biphas (LO LOESTRIN FE) 1 MG-10 MCG / 10 MCG tablet Take 1 tablet by mouth daily. 84 tablet 2   sertraline (ZOLOFT) 50 MG tablet Take 1  tablet (50 mg total) by mouth daily. Take 1/2 tab daily for first 2 weeks. (Patient not taking: Reported on 01/25/2023) 30 tablet 3   No current facility-administered medications for this visit.     ALLERGIES: Patient has no known allergies.  Family History  Problem Relation Age of Onset   Hypertension Mother    Sickle cell anemia Sister    Asthma Sister    Diabetes Maternal Aunt    Hypertension Maternal Aunt    Endometrial cancer Paternal Aunt    Stroke Maternal Grandmother    Hypertension Maternal Grandmother    Hyperlipidemia Maternal Grandmother    Heart disease Maternal Grandmother    Diabetes Maternal Grandmother    Asthma Son     Social History   Socioeconomic History   Marital status: Single    Spouse name: Not on file   Number of children: Not on file   Years of education: Not on file   Highest education level: Not on file  Occupational History   Not on file  Tobacco Use   Smoking status: Former    Current packs/day: 0.00  Average packs/day: 0.3 packs/day for 2.0 years (0.5 ttl pk-yrs)    Types: Cigarettes, E-cigarettes    Start date: 09/26/2007    Quit date: 09/25/2009    Years since quitting: 13.8   Smokeless tobacco: Never   Tobacco comments:    VAPES  Vaping Use   Vaping status: Never Used  Substance and Sexual Activity   Alcohol use: Yes    Comment: weekends   Drug use: Yes    Types: Marijuana   Sexual activity: Yes    Partners: Male    Birth control/protection: Surgical    Comment: tubal  Other Topics Concern   Not on file  Social History Narrative   Not on file   Social Determinants of Health   Financial Resource Strain: Not on file  Food Insecurity: Not on file  Transportation Needs: Not on file  Physical Activity: Not on file  Stress: Not on file  Social Connections: Not on file  Intimate Partner Violence: Not on file    Review of Systems  PHYSICAL EXAMINATION:    There were no vitals taken for this visit.    General  appearance: alert, cooperative and appears stated age Head: Normocephalic, without obvious abnormality, atraumatic Neck: no adenopathy, supple, symmetrical, trachea midline and thyroid normal to inspection and palpation Lungs: clear to auscultation bilaterally Breasts: normal appearance, no masses or tenderness, No nipple retraction or dimpling, No nipple discharge or bleeding, No axillary or supraclavicular adenopathy Heart: regular rate and rhythm Abdomen: soft, non-tender, no masses,  no organomegaly Extremities: extremities normal, atraumatic, no cyanosis or edema Skin: Skin color, texture, turgor normal. No rashes or lesions Lymph nodes: Cervical, supraclavicular, and axillary nodes normal. No abnormal inguinal nodes palpated Neurologic: Grossly normal  Pelvic: External genitalia:  no lesions              Urethra:  normal appearing urethra with no masses, tenderness or lesions              Bartholins and Skenes: normal                 Vagina: normal appearing vagina with normal color and discharge, no lesions              Cervix: no lesions                Bimanual Exam:  Uterus:  normal size, contour, position, consistency, mobility, non-tender              Adnexa: no mass, fullness, tenderness              Rectal exam: {yes no:[id number]}.  Confirms.              Anus:  normal sphincter tone, no lesions  Chaperone was present for exam:  ***  ASSESSMENT     PLAN     An After Visit Summary was printed and given to the patient.  ______ minutes face to face time of which over 50% was spent in counseling.

## 2023-07-28 ENCOUNTER — Ambulatory Visit: Payer: Medicaid Other | Admitting: Obstetrics and Gynecology

## 2023-11-03 ENCOUNTER — Encounter (HOSPITAL_BASED_OUTPATIENT_CLINIC_OR_DEPARTMENT_OTHER): Payer: Self-pay

## 2023-11-03 ENCOUNTER — Emergency Department (HOSPITAL_BASED_OUTPATIENT_CLINIC_OR_DEPARTMENT_OTHER)
Admission: EM | Admit: 2023-11-03 | Discharge: 2023-11-03 | Disposition: A | Discharge status: Home / Self Care | Payer: Medicaid Other | Attending: Emergency Medicine | Admitting: Emergency Medicine

## 2023-11-03 ENCOUNTER — Other Ambulatory Visit: Payer: Self-pay

## 2023-11-03 ENCOUNTER — Emergency Department (HOSPITAL_BASED_OUTPATIENT_CLINIC_OR_DEPARTMENT_OTHER): Payer: Medicaid Other

## 2023-11-03 DIAGNOSIS — Y9389 Activity, other specified: Secondary | ICD-10-CM | POA: Diagnosis not present

## 2023-11-03 DIAGNOSIS — S63602A Unspecified sprain of left thumb, initial encounter: Secondary | ICD-10-CM | POA: Insufficient documentation

## 2023-11-03 DIAGNOSIS — X501XXA Overexertion from prolonged static or awkward postures, initial encounter: Secondary | ICD-10-CM | POA: Insufficient documentation

## 2023-11-03 DIAGNOSIS — S6992XA Unspecified injury of left wrist, hand and finger(s), initial encounter: Secondary | ICD-10-CM | POA: Diagnosis present

## 2023-11-03 NOTE — ED Provider Notes (Signed)
EMERGENCY DEPARTMENT AT [facility]  Provider Note  CSN: [id number] Arrival date & time: 11/03/23 0455  History Chief Complaint  Patient presents with   Finger Injury    LT thumb    Christine Cooke is a 38 y.o. female reports she injured her left thumb moving some boxes 2 days ago. Pain mostly at MCP joint. No other injuries.    Home Medications Prior to Admission medications   Medication Sig Start Date End Date Taking? Authorizing Provider  FeFum-FePoly-FA-B Cmp-C-Biot (INTEGRA PLUS) CAPS Take 1 capsule by mouth every morning. Patient not taking: Reported on 01/25/2023 01/11/23   Heilingoetter, Cassandra L, PA-C  folic acid (FOLVITE) 1 MG tablet Take 1 tablet (1 mg total) by mouth daily. 01/11/23   Heilingoetter, Cassandra L, PA-C  Norethindrone-Ethinyl Estradiol-Fe Biphas (LO LOESTRIN FE) 1 MG-10 MCG / 10 MCG tablet Take 1 tablet by mouth daily. 01/25/23   Chrzanowski, Jami B, NP  sertraline (ZOLOFT) 50 MG tablet Take 1 tablet (50 mg total) by mouth daily. Take 1/2 tab daily for first 2 weeks. Patient not taking: Reported on 01/25/2023 12/20/22   Sharlene Dory, DO     Allergies    Patient has no known allergies.   Review of Systems   Review of Systems Please see HPI for pertinent positives and negatives  Physical Exam BP (!) 158/92   Pulse 84   Temp 98.3 F (36.8 C)   Resp 16   Ht 5\' 7"  (1.702 m)   Wt 91 kg   SpO2 100%   BMI 31.42 kg/m   Physical Exam Vitals and nursing note reviewed.  HENT:     Head: Normocephalic.     Nose: Nose normal.  Eyes:     Extraocular Movements: Extraocular movements intact.  Pulmonary:     Effort: Pulmonary effort is normal.  Musculoskeletal:        General: Swelling and tenderness (L 1st MCP joint) present. Normal range of motion.     Cervical back: Neck supple.  Skin:    Findings: No rash (on exposed skin).  Neurological:     Mental Status: She is alert and oriented to person, place, and time.   Psychiatric:        Mood and Affect: Mood normal.     ED Results / Procedures / Treatments   EKG None  Procedures Procedures  Medications Ordered in the ED Medications - No data to display  Initial Impression and Plan  Patient here with L thumb injury, pain and swelling. Check xray.   ED Course   Clinical Course as of 11/03/23 0636  Thu Nov 03, 2023  0635 I personally viewed the images from radiology studies and agree with radiologist interpretation:  Xray is neg for fracture. Will ace wrap for comfort. Rest, ice and elevation, OTC meds for pain. PCP follow up, RTED for any other concerns.   [CS]    Clinical Course User Index [CS] Pollyann Savoy, MD     MDM Rules/Calculators/A&P Medical Decision Making Problems Addressed: Sprain of left thumb, unspecified site of digit, initial encounter: acute illness or injury  Amount and/or Complexity of Data Reviewed Radiology: ordered and independent interpretation performed. Decision-making details documented in ED Course.  Risk OTC drugs.     Final Clinical Impression(s) / ED Diagnoses Final diagnoses:  Sprain of left thumb, unspecified site of digit, initial encounter    Rx / DC Orders ED Discharge Orders     None  Pollyann Savoy, MD 11/03/23 [phone]

## 2023-11-03 NOTE — ED Notes (Signed)
Applied ace wrap pt. Tolerated well

## 2023-11-03 NOTE — ED Triage Notes (Signed)
Pt injured LT thumb while taking something out of car day before yesterday and it is now difficult to bend it. Pt also has pain, swelling, and bruising to base of thumb.

## 2024-01-24 ENCOUNTER — Ambulatory Visit (INDEPENDENT_AMBULATORY_CARE_PROVIDER_SITE_OTHER): Admitting: Radiology

## 2024-01-24 ENCOUNTER — Encounter: Payer: Self-pay | Admitting: Radiology

## 2024-01-24 ENCOUNTER — Ambulatory Visit: Admitting: Family Medicine

## 2024-01-24 VITALS — BP 110/76 | Wt 199.0 lb

## 2024-01-24 DIAGNOSIS — N760 Acute vaginitis: Secondary | ICD-10-CM

## 2024-01-24 DIAGNOSIS — Z113 Encounter for screening for infections with a predominantly sexual mode of transmission: Secondary | ICD-10-CM | POA: Diagnosis not present

## 2024-01-24 DIAGNOSIS — N750 Cyst of Bartholin's gland: Secondary | ICD-10-CM

## 2024-01-24 DIAGNOSIS — N898 Other specified noninflammatory disorders of vagina: Secondary | ICD-10-CM

## 2024-01-24 LAB — WET PREP FOR TRICH, YEAST, CLUE

## 2024-01-24 MED ORDER — NUVESSA 1.3 % VA GEL: 1.0000 | Freq: Once | VAGINAL | 0 refills | Status: AC

## 2024-01-24 NOTE — Progress Notes (Signed)
      Subjective: Christine Cooke is a 38 y.o. female who complains of vaginal d/c with odor. Would like STI screening. 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 She was previously scheduled for marsupialization while she was under the care of a [facility] but had to cancel because she could not get the time off of work. She is now interested in long term management because it is uncomfortable at times to sit or to have intercourse.      Review of Systems  All other systems reviewed and are negative.   Past Medical History:  Diagnosis Date   Anemia    GERD (gastroesophageal reflux disease)    with pregnancy   Infection    UTI   Mood swings    Ovarian cyst       Objective:  Today's Vitals   01/24/24 0836  BP: 110/76  Weight: 199 lb (90.3 kg)   Body mass index is 31.17 kg/m.   Physical Exam Vitals and nursing note reviewed. Exam conducted with a chaperone present.  Constitutional:      Appearance: Normal appearance. She is well-developed.  Pulmonary:     Effort: Pulmonary effort is normal.  Abdominal:     General: Abdomen is flat.     Palpations: Abdomen is soft.  Genitourinary:    General: Normal vulva.     Vagina: Vaginal discharge present. No erythema, bleeding or lesions.     Cervix: Normal. No discharge, friability, lesion or erythema.     Uterus: Normal.      Adnexa: Right adnexa normal and left adnexa normal.       Comments: 6cm bartholin's cyst, minimally fluctuant. No erythema or drainage, nontender on exam Neurological:     Mental Status: She is alert.  Psychiatric:        Mood and Affect: Mood normal.        Thought Content: Thought content normal.        Judgment: Judgment normal.      Microscopic wet-mount exam shows clue cells.   Raynelle Fanning, CMA present for exam  Assessment:/Plan:   1. Vaginal odor (Primary) +BV - WET PREP FOR TRICH, YEAST, CLUE - metroNIDAZOLE (NUVESSA) 1.3 % GEL; Place 1 Dose  vaginally once for 1 dose.  Dispense: 5 g; Refill: 0  2. Screening for STDs (sexually transmitted diseases) - SURESWAB CT/NG/T. vaginalis  3. Bartholin gland cyst Will schedule for surgical consult to discuss definitive management further No I+D or antibiotics needed at this time    Will contact patient with results of testing completed today. Avoid intercourse until symptoms are resolved. Safe sex encouraged. Avoid the use of soaps or perfumed products in the peri area. Avoid tub baths and sitting in sweaty or wet clothing for prolonged periods of time.   Return for surg consult GH bartholins.   Bonner Larue B, NP 8:50 AM

## 2024-01-26 LAB — SURESWAB CT/NG/T. VAGINALIS
C. trachomatis RNA, TMA: NOT DETECTED
N. gonorrhoeae RNA, TMA: NOT DETECTED
Trichomonas vaginalis RNA: NOT DETECTED

## 2024-01-27 ENCOUNTER — Ambulatory Visit: Admitting: Family Medicine

## 2024-01-27 DIAGNOSIS — N898 Other specified noninflammatory disorders of vagina: Secondary | ICD-10-CM

## 2024-01-27 MED ORDER — NUVESSA 1.3 % VA GEL: 1.0000 | Freq: Once | VAGINAL | 0 refills | Status: AC

## 2024-01-30 ENCOUNTER — Encounter: Payer: Self-pay | Admitting: Obstetrics and Gynecology

## 2024-01-30 ENCOUNTER — Ambulatory Visit (INDEPENDENT_AMBULATORY_CARE_PROVIDER_SITE_OTHER): Admitting: Obstetrics and Gynecology

## 2024-01-30 VITALS — BP 120/82 | HR 91 | Temp 98.2°F | Ht 67.0 in | Wt 197.0 lb

## 2024-01-30 DIAGNOSIS — N750 Cyst of Bartholin's gland: Secondary | ICD-10-CM | POA: Diagnosis not present

## 2024-01-30 NOTE — Patient Instructions (Signed)
 Bartholin's Cyst Incision and Drainage  Bartholin's glands are two pea-sized glands located just outside the opening of the vagina. There is one on each side, inside the inner folds of skin of the vagina (labia minora). These glands secrete a fluid through tubes (ducts) that open into the labia minora. If a duct becomes blocked, a gland can swell and fill with mucus (Bartholin's cyst). Sometimes the gland becomes infected with bacteria and fills with pus (Bartholin's abscess). Incision and drainage is a surgical procedure to open and drain a cyst or abscess. You may need this surgery if: You have a large cyst that interferes with sexual activity. You have a painful cyst. You have a cyst that becomes infected and turns into an abscess. Tell your health care provider about: Any allergies you have. All medicines you are taking, including vitamins, herbs, eye drops, creams, and over-the-counter medicines. Any problems you or family members have had with anesthetic medicines. Any bleeding problems you have. Any surgeries you have had. Any medical conditions you have. Whether you are pregnant or may be pregnant. What are the risks? Your health care provider will talk with you about risks. These may include: The cyst coming back (recurring) is the most common risk. Bleeding. Infection of a cyst. Infection spreading from an abscess. Poor wound healing. What happens before the procedure? Surgery safety Ask your health care provider what steps will be taken to help prevent infection. These steps may include: Removing hair at the surgery site. Washing skin with a soap that kills germs. Taking antibiotics. General instructions Ask your health care provider about: Changing or stopping your regular medicines. These include any diabetes medicines or blood thinners you take. Taking medicines such as aspirin and ibuprofen. These medicines can thin your blood. Do not take them unless your health care  provider tells you to. Taking over-the-counter medicines, vitamins, herbs, and supplements. Follow instructions from your health care provider about what you may eat and drink. If you will be going home right after the procedure, plan to have a responsible adult: Take you home from the hospital or clinic. You will not be allowed to drive. Care for you for the time you are told. What happens during the surgery? An IV will be inserted into one of your veins. You may be given: A sedative. This helps you relax. Anesthesia. This will: Numb certain areas of your body. Make you fall asleep for surgery. An incision will be made over the cyst or abscess. The contents of the cyst or abscess will be drained and the space (cavity) flushed out. A small, thin tube (catheter) may be inserted into the cyst or abscess cavity. This catheter is left in place for about 4 weeks. If you do not have a catheter placed, the edges of the incision will be stitched with absorbable stitches (sutures) in a way to create a small opening for drainage. Both help prevent recurrence. The procedure may vary among health care providers and hospitals. What happens after the procedure? Your blood pressure, heart rate, breathing rate, and blood oxygen level will be monitored until you leave the hospital or clinic. If you were given a sedative during the procedure, it can affect you for several hours. Do not drive or operate machinery until your health care provider says that it is safe. After your procedure, it is common to have mild pain and light discharge from the vagina. You will go back to have the catheter removed. After removal, there will be a  channel from the cyst or abscess cavity to the skin. This will help prevent recurrence. Summary Incision and drainage is a surgical procedure to open and drain a cyst or abscess. The main risk of this procedure is recurrence of the cyst or abscess. To prevent this recurrence, a  catheter may be inserted. The incision can also be stitched in a way that allows drainage. After your procedure, it is common to have mild pain and light discharge from the vagina. If you were given a sedative during the procedure, it can affect you for several hours. Do not drive or operate machinery until your health care provider says that it is safe. This information is not intended to replace advice given to you by your health care provider. Make sure you discuss any questions you have with your health care provider. Document Revised: 02/04/2022 Document Reviewed: 02/05/2022 Elsevier Patient Education  2024 [employer].

## 2024-01-30 NOTE — Progress Notes (Signed)
 38 y.o. Christine Cooke female s/p BTL, history of CD x 4 with menorrhagia here for bartholin's cyst. Single. [employer]- works from home.  Patient's last menstrual period was 01/07/2024 (approximate).   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 She was previously scheduled for marsupialization while she was under the care of a [facility] but had to cancel because she could not get the time off of work.   Today, she reports she started having issues with the cyst after her last C-section.  She has never had the cyst drained and has never had to be treated with antibiotics for infection. Now bothers her when she sits.  GYN HISTORY: Cesarean delivery x 4 BTL  OB History  Gravida Para Term Preterm AB Living  5 4 4  1 4   SAB IAB Ectopic Multiple Live Births   1  0 4    # Outcome Date GA Lbr Len/2nd Weight Sex Type Anes PTL Lv  5 Term 09/27/14 [redacted]w[redacted]d  7 lb 10.1 oz (3.46 kg) M CS-LTranv Spinal  LIV  4 Term 07/03/12    M CS-LTranv Spinal  LIV  3 Term 05/01/09    F CS-LTranv Spinal  LIV  2 Term 10/29/06    F CS-LTranv EPI  LIV  1 IAB 2005            Past Medical History:  Diagnosis Date   Anemia    GERD (gastroesophageal reflux disease)    with pregnancy   Infection    UTI   Mood swings    Ovarian cyst     Past Surgical History:  Procedure Laterality Date   CESAREAN SECTION     CESAREAN SECTION WITH BILATERAL TUBAL LIGATION  09/27/2014   Procedure: CESAREAN SECTION WITH BILATERAL TUBAL LIGATION;  Surgeon: Catalina Antigua, MD;  Location: [facility];  Service: Obstetrics;;   INDUCED ABORTION     TUBAL LIGATION      No current outpatient medications on file prior to visit.   No current facility-administered medications on file prior to visit.    No Known Allergies    PE Today's Vitals   01/30/24 1506  BP: 120/82  Pulse: 91  Temp: 98.2 F (36.8 C)  TempSrc: Oral  SpO2: 98%  Weight: 197 lb (89.4 kg)  Height: 5\' 7"  (1.702 m)   Body  mass index is 30.85 kg/m.  Physical Exam Vitals reviewed. Exam conducted with a chaperone present.  Constitutional:      General: She is not in acute distress.    Appearance: Normal appearance.  HENT:     Head: Normocephalic and atraumatic.     Nose: Nose normal.  Eyes:     Extraocular Movements: Extraocular movements intact.     Conjunctiva/sclera: Conjunctivae normal.  Pulmonary:     Effort: Pulmonary effort is normal.  Genitourinary:    General: Normal vulva.     Exam position: Lithotomy position.     Vagina: Normal. No vaginal discharge.     Cervix: Normal. No cervical motion tenderness, discharge or lesion.     Uterus: Normal. Not enlarged and not tender.      Adnexa: Right adnexa normal and left adnexa normal.     Comments: 5cm left bartholin's gland cyst Musculoskeletal:        General: Normal range of motion.     Cervical back: Normal range of motion.  Neurological:     General: No focal deficit present.  Mental Status: She is alert.  Psychiatric:        Mood and Affect: Mood normal.        Behavior: Behavior normal.     Assessment and Plan:        Bartholin gland cyst  Never previously drained Discussed I&D + Word vs marsupialization Patient wants to proceed with Batholin's drainage, Word catheter placement  RTO for Batholin's drainage, Word catheter placement  Aware of pelvic rest x2wk  Romaine Closs, MD

## 2024-02-09 ENCOUNTER — Other Ambulatory Visit (HOSPITAL_COMMUNITY)
Admission: RE | Admit: 2024-02-09 | Discharge: 2024-02-09 | Disposition: A | Discharge status: Home / Self Care | Source: Ambulatory Visit | Attending: Obstetrics and Gynecology | Admitting: Obstetrics and Gynecology

## 2024-02-09 ENCOUNTER — Ambulatory Visit (INDEPENDENT_AMBULATORY_CARE_PROVIDER_SITE_OTHER): Admitting: Obstetrics and Gynecology

## 2024-02-09 ENCOUNTER — Encounter: Payer: Self-pay | Admitting: Obstetrics and Gynecology

## 2024-02-09 VITALS — BP 118/80 | HR 92 | Temp 98.2°F | Wt 196.0 lb

## 2024-02-09 DIAGNOSIS — N750 Cyst of Bartholin's gland: Secondary | ICD-10-CM | POA: Insufficient documentation

## 2024-02-09 NOTE — Progress Notes (Signed)
 38 y.o. [name] s/p BTL, history of CD x 4 with menorrhagia here for bartholin's cyst I&D and word placement. Single. [employer]- works from home.  Patient's last menstrual period was 02/02/2024 (approximate).   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 She was previously scheduled for marsupialization while she was under the care of a [facility] but had to cancel because she could not get the time off of work.   Today, she reports she started having issues with the cyst after her last C-section.  She has never had the cyst drained and has never had to be treated with antibiotics for infection. Now bothers her when she sits.  GYN HISTORY: Cesarean delivery x 4 BTL  OB History  Gravida Para Term Preterm AB Living  5 4 4  1 4   SAB IAB Ectopic Multiple Live Births   1  0 4    # Outcome Date GA Lbr Len/2nd Weight Sex Type Anes PTL Lv  5 Term 09/27/14 [redacted]w[redacted]d  7 lb 10.1 oz (3.46 kg) M CS-LTranv Spinal  LIV  4 Term 07/03/12    M CS-LTranv Spinal  LIV  3 Term 05/01/09    F CS-LTranv Spinal  LIV  2 Term 10/29/06    F CS-LTranv EPI  LIV  1 IAB 2005            Past Medical History:  Diagnosis Date   Anemia    GERD (gastroesophageal reflux disease)    with pregnancy   Infection    UTI   Mood swings    Ovarian cyst     Past Surgical History:  Procedure Laterality Date   CESAREAN SECTION     CESAREAN SECTION WITH BILATERAL TUBAL LIGATION  09/27/2014   Procedure: CESAREAN SECTION WITH BILATERAL TUBAL LIGATION;  Surgeon: Verlyn Goad, MD;  Location: [facility];  Service: Obstetrics;;   INDUCED ABORTION     TOOTH EXTRACTION     TUBAL LIGATION      No current outpatient medications on file prior to visit.   No current facility-administered medications on file prior to visit.    No Known Allergies    PE Today's Vitals   02/09/24 1511  BP: 118/80  Pulse: 92  Temp: 98.2 F (36.8 C)  TempSrc: Oral  SpO2:  99%  Weight: 196 lb (88.9 kg)   Body mass index is 30.7 kg/m.  Physical Exam Vitals reviewed. Exam conducted with a chaperone present.  Constitutional:      General: She is not in acute distress.    Appearance: Normal appearance.  HENT:     Head: Normocephalic and atraumatic.     Nose: Nose normal.  Eyes:     Extraocular Movements: Extraocular movements intact.     Conjunctiva/sclera: Conjunctivae normal.  Pulmonary:     Effort: Pulmonary effort is normal.  Genitourinary:    General: Normal vulva.     Exam position: Lithotomy position.     Vagina: Normal. No vaginal discharge.     Cervix: Normal. No cervical motion tenderness, discharge or lesion.     Uterus: Normal. Not enlarged and not tender.      Adnexa: Right adnexa normal and left adnexa normal.     Comments: 5cm left bartholin's gland cyst Musculoskeletal:        General: Normal range of motion.     Cervical back: Normal range of motion.  Neurological:  General: No focal deficit present.     Mental Status: She is alert.  Psychiatric:        Mood and Affect: Mood normal.        Behavior: Behavior normal.     Procedure Risks were reviewed and consent was obtained.   The left bartholin's cyst was cleansed with betadine and injected with 1% lidocaine  just outside the vestibule. The abscess was incised with a #3 punch biopsy. Tissue was sent for pathology. Frank pus drained. A culture was obtained. The word catheter was paced into the bartholin's cyst and inflated with 3 cc of NS. The end of the word catheter was placed into the vagina.  The patient's perineum was cleansed.  She tolerated the procedure well. There were no complications.    Assessment and Plan:        Bartholin gland cyst -     Anaerobic and Aerobic Culture -     Surgical pathology  Uncomplicated procedure RTO in 2 weeks  Romaine Closs, MD

## 2024-02-09 NOTE — Patient Instructions (Signed)
 Please call our office with heavy vaginal bleeding, severe abdominal pain or fever. Avoid intercourse, tampon use, douching for 14 days to decrease the risk of infection.   Clean your incision daily with mild soapy water.  Sitz bath are helpful for wound healing.   Ice packs can be applied to your incision for up to 20 minutes at a time to help with pain and swelling. Take ibuprofen  as prescribed and over-the-counter Tylenol  as needed.

## 2024-02-13 ENCOUNTER — Encounter: Payer: Self-pay | Admitting: Obstetrics and Gynecology

## 2024-02-14 ENCOUNTER — Encounter: Payer: Self-pay | Admitting: Obstetrics and Gynecology

## 2024-02-14 LAB — SURGICAL PATHOLOGY

## 2024-02-14 NOTE — Telephone Encounter (Signed)
 Patient scheduled for 02/20/24 at 2:30 for follow up

## 2024-02-15 LAB — ANAEROBIC AND AEROBIC CULTURE
AER RESULT:: NO GROWTH
GRAM STAIN:: NO GROWTH
MICRO NUMBER:: 16370902
SPECIMEN QUALITY:: ADEQUATE

## 2024-02-20 ENCOUNTER — Ambulatory Visit (INDEPENDENT_AMBULATORY_CARE_PROVIDER_SITE_OTHER): Admitting: Obstetrics and Gynecology

## 2024-02-20 ENCOUNTER — Encounter: Payer: Self-pay | Admitting: Obstetrics and Gynecology

## 2024-02-20 VITALS — BP 106/64 | HR 96 | Wt 196.0 lb

## 2024-02-20 DIAGNOSIS — N750 Cyst of Bartholin's gland: Secondary | ICD-10-CM | POA: Diagnosis not present

## 2024-02-20 DIAGNOSIS — Z113 Encounter for screening for infections with a predominantly sexual mode of transmission: Secondary | ICD-10-CM

## 2024-02-20 NOTE — Progress Notes (Signed)
 38 y.o. [id number] female s/p BTL, history of CD x 4 with menorrhagia here for word catheter removal. Single. [employer] insurance- works from home.  Patient's last menstrual period was 02/02/2024 (approximate).   She reports an uncomfortable feeling with tube placement, a lot of dryness, used vasaline, and A&D ointment for relief.   02/09/24 culture: NGTD  02/09/24 pathology: A. BARTHOLIN GLAND CYST, EXCISION:  Skin with chronic inflammation and mild reactive changes.  Negative for dysplasia and malignancy.  Differential includes inflammation above and inflamed Bartholin gland cyst.  No cyst epithelial lining is present.  Multiple additional levels examined.   GYN HISTORY: Cesarean delivery x 4 BTL  OB History  Gravida Para Term Preterm AB Living  5 4 4  1 4   SAB IAB Ectopic Multiple Live Births   1  0 4    # Outcome Date GA Lbr Len/2nd Weight Sex Type Anes PTL Lv  5 Term 09/27/14 [redacted]w[redacted]d  7 lb 10.1 oz (3.46 kg) M CS-LTranv Spinal  LIV  4 Term 07/03/12    M CS-LTranv Spinal  LIV  3 Term 05/01/09    F CS-LTranv Spinal  LIV  2 Term 10/29/06    F CS-LTranv EPI  LIV  1 IAB 2005            Past Medical History:  Diagnosis Date   Anemia    GERD (gastroesophageal reflux disease)    with pregnancy   Infection    UTI   Mood swings    Ovarian cyst     Past Surgical History:  Procedure Laterality Date   CESAREAN SECTION     CESAREAN SECTION WITH BILATERAL TUBAL LIGATION  09/27/2014   Procedure: CESAREAN SECTION WITH BILATERAL TUBAL LIGATION;  Surgeon: Verlyn Goad, MD;  Location: [facility];  Service: Obstetrics;;   INDUCED ABORTION     TOOTH EXTRACTION     TUBAL LIGATION      No current outpatient medications on file prior to visit.   No current facility-administered medications on file prior to visit.    No Known Allergies    PE Today's Vitals   02/20/24 1441  BP: 106/64  Pulse: 96  SpO2: 99%  Weight: 196 lb (88.9 kg)   Body mass index is 30.7 kg/m.  Physical  Exam Vitals reviewed. Exam conducted with a chaperone present.  Constitutional:      General: She is not in acute distress.    Appearance: Normal appearance.  HENT:     Head: Normocephalic and atraumatic.     Nose: Nose normal.  Eyes:     Extraocular Movements: Extraocular movements intact.     Conjunctiva/sclera: Conjunctivae normal.  Pulmonary:     Effort: Pulmonary effort is normal.  Genitourinary:    Exam position: Lithotomy position.     Vagina: Normal. No vaginal discharge.     Cervix: Normal. No cervical motion tenderness, discharge or lesion.     Uterus: Normal. Not enlarged and not tender.      Adnexa: Right adnexa normal and left adnexa normal.     Comments: Multiple small ulcerations along vulva, at different stages of healing Left bartholin's gland cyst normal size, appropriate fluid draining around Word cath, no signs of infection Musculoskeletal:        General: Normal range of motion.     Cervical back: Normal range of motion.  Neurological:     General: No focal deficit present.     Mental Status: She is alert.  Psychiatric:  Mood and Affect: Mood normal.        Behavior: Behavior normal.     Procedure Word catheter was drainage and removed without complications.  Assessment and Plan:        Bartholin gland cyst Uncomplicated removal of Word catheter  Screen for STD (sexually transmitted disease) Vulvar ulcerations noted. No history of HSV. Recommend STD testing. Patient in agreement. -     Hepatitis B surface antigen -     Hepatitis C antibody -     RPR -     HIV Antibody (routine testing w rflx) -     HSV(herpes simplex vrs) 1+2 ab-IgG -     C. trachomatis/N. gonorrhoeae RNA  Romaine Closs, MD

## 2024-02-21 ENCOUNTER — Encounter: Payer: Self-pay | Admitting: Obstetrics and Gynecology

## 2024-02-21 LAB — C. TRACHOMATIS/N. GONORRHOEAE RNA
C. trachomatis RNA, TMA: NOT DETECTED
N. gonorrhoeae RNA, TMA: NOT DETECTED

## 2024-02-22 ENCOUNTER — Encounter: Payer: Self-pay | Admitting: Obstetrics and Gynecology

## 2024-02-22 LAB — HEPATITIS B SURFACE ANTIGEN: Hepatitis B Surface Ag: NONREACTIVE

## 2024-02-22 LAB — HSV(HERPES SIMPLEX VRS) I + II AB-IGG
HSV 1 IGG,TYPE SPECIFIC AB: <0.9 {index}
HSV 2 IGG,TYPE SPECIFIC AB: <0.9 {index}

## 2024-02-22 LAB — HEPATITIS C ANTIBODY: Hepatitis C Ab: NONREACTIVE

## 2024-02-22 LAB — HIV ANTIBODY (ROUTINE TESTING W REFLEX): HIV 1&2 Ab, 4th Generation: NONREACTIVE

## 2024-02-22 LAB — RPR: RPR Ser Ql: NONREACTIVE

## 2024-02-24 ENCOUNTER — Ambulatory Visit: Admitting: Obstetrics and Gynecology

## 2024-03-07 ENCOUNTER — Encounter: Admitting: Family Medicine

## 2024-03-21 ENCOUNTER — Ambulatory Visit: Admitting: Radiology

## 2024-04-06 NOTE — Telephone Encounter (Signed)
 Jami -please review and advise if OV recommended or ok for lab appt?

## 2024-04-06 NOTE — Telephone Encounter (Signed)
 OV.Can she come in before 3?

## 2024-04-06 NOTE — Telephone Encounter (Signed)
 Spoke with patient, advised per Jami.  Patient declines visit today, is uncertain she can make it by 3. Requesting OV next week before 10am.   OV scheduled for 6/24 at 0945 with Jami.   UC/ER Precautions provided for new or worsening symptoms.   Patient agreeable and appreciative of call.   Routing to provider for final review. Patient is agreeable to disposition. Will close encounter.

## 2024-04-10 ENCOUNTER — Encounter: Payer: Self-pay | Admitting: Radiology

## 2024-04-10 ENCOUNTER — Ambulatory Visit (INDEPENDENT_AMBULATORY_CARE_PROVIDER_SITE_OTHER): Admitting: Radiology

## 2024-04-10 VITALS — BP 114/76 | HR 90 | Temp 98.4°F | Wt 191.8 lb

## 2024-04-10 DIAGNOSIS — R8290 Unspecified abnormal findings in urine: Secondary | ICD-10-CM | POA: Diagnosis not present

## 2024-04-10 DIAGNOSIS — N760 Acute vaginitis: Secondary | ICD-10-CM | POA: Diagnosis not present

## 2024-04-10 DIAGNOSIS — B9689 Other specified bacterial agents as the cause of diseases classified elsewhere: Secondary | ICD-10-CM

## 2024-04-10 MED ORDER — SULFAMETHOXAZOLE-TRIMETHOPRIM 800-160 MG PO TABS: 1.0000 | ORAL_TABLET | Freq: Two times a day (BID) | ORAL | 0 refills | Status: DC

## 2024-04-10 MED ORDER — NUVESSA 1.3 % VA GEL: 1.0000 | Freq: Once | VAGINAL | 0 refills | Status: AC

## 2024-04-10 NOTE — Progress Notes (Signed)
      SUBJECTIVE: Christine Cooke is a 38 y.o. female who complains of cloudy urine, frequency. Symptoms began last week.   No Known Allergies  No current outpatient medications on file prior to visit.   No current facility-administered medications on file prior to visit.    Past Medical History:  Diagnosis Date   Anemia    GERD (gastroesophageal reflux disease)    with pregnancy   Infection    UTI   Mood swings    Ovarian cyst      OBJECTIVE: Appears well, in no apparent distress.  Vital signs are normal. The abdomen is soft without tenderness, guarding, mass, rebound or organomegaly. No CVA tenderness or inguinal adenopathy noted. Urine dipstick shows positive for RBC's, positive for protein, positive for leukocytes, positive for urobilinogen, and positive for ketones.  Micro exam: 10-20 WBC's per HPF, 3-10 RBC's per HPF, moderate+ bacteria, and contaminated specimen with clue cells.    Blood pressure 114/76, pulse 90, temperature 98.4 F (36.9 C), temperature source Oral, weight 191 lb 12.8 oz (87 kg), last menstrual period 03/24/2024, SpO2 98%.    Chaperone offered and declined for exam.  ASSESSMENT/PLAN: 1. Cloudy urine (Primary) - Urinalysis,Complete w/RFL Culture - sulfamethoxazole -trimethoprim  (BACTRIM  DS) 800-160 MG tablet; Take 1 tablet by mouth 2 (two) times daily.  Dispense: 6 tablet; Refill: 0  2. Bacterial vaginosis - metroNIDAZOLE  (NUVESSA ) 1.3 % GEL; Place 1 Dose vaginally once for 1 dose.  Dispense: 5 g; Refill: 0    Will send urine culture and sensitivity.  Treatment per orders - also push fluids, avoid bladder irritants. Instructed she may use Pyridium OTC prn. Call or return to clinic prn if these symptoms worsen or fail to improve as anticipated. Pyelo precautions reviewed with patient.   Tya Haughey B, NP 10:04 AM

## 2024-04-10 NOTE — Patient Instructions (Signed)

## 2024-04-12 ENCOUNTER — Ambulatory Visit: Payer: Self-pay | Admitting: Radiology

## 2024-04-12 LAB — URINALYSIS, COMPLETE W/RFL CULTURE
Glucose, UA: NEGATIVE
Hyaline Cast: NONE SEEN /LPF
Nitrites, Initial: NEGATIVE
Specific Gravity, Urine: 1.02 (ref 1.001–1.035)
pH: 6 (ref 5.0–8.0)

## 2024-04-12 LAB — URINE CULTURE
MICRO NUMBER:: 16618047
SPECIMEN QUALITY:: ADEQUATE

## 2024-04-12 LAB — CULTURE INDICATED

## 2024-05-04 ENCOUNTER — Ambulatory Visit: Admitting: Radiology

## 2024-07-25 ENCOUNTER — Ambulatory Visit: Admitting: Radiology

## 2024-08-15 ENCOUNTER — Ambulatory Visit

## 2024-08-20 NOTE — Telephone Encounter (Signed)
 Please schedule OV with a provider this week.

## 2024-08-21 NOTE — Telephone Encounter (Signed)
 Spoke with patient. Advised per Jami.  Patient reports swollen and painful left bartholin gland. No drainage. Denies fever/chills. States this is the same location she has been seen for in the past, currently scheduled for Word cath placement on 08/31/24 with Dr. Dallie.   Patient is requesting OV on 11/5, states she is off work this day. Patient declines OV with Dr. Dallie on 11/6 due to work schedule. OV scheduled for 11/5 at 0800 with Dr. Glennon. Encouraged warm soaks and motrin  800 mg PO q8hrs for pain. Call if any concerns or changes prior to appointment.   Will keep visit scheduled for 11/14 with Dr. Dallie, can cancel if not needed. Advised I will update providers and return call if any additional recommendations. Patient agreeable.   Routing to provider for final review. Patient is agreeable to disposition. Will close encounter.  Cc: Dr. Glennon

## 2024-08-22 ENCOUNTER — Encounter: Payer: Self-pay | Admitting: Obstetrics and Gynecology

## 2024-08-22 ENCOUNTER — Ambulatory Visit (INDEPENDENT_AMBULATORY_CARE_PROVIDER_SITE_OTHER): Admitting: Obstetrics and Gynecology

## 2024-08-22 VITALS — BP 126/74 | HR 94 | Wt 195.6 lb

## 2024-08-22 DIAGNOSIS — N750 Cyst of Bartholin's gland: Secondary | ICD-10-CM

## 2024-08-22 DIAGNOSIS — N898 Other specified noninflammatory disorders of vagina: Secondary | ICD-10-CM

## 2024-08-22 NOTE — Progress Notes (Signed)
   Acute Office Visit  Subjective:    Patient ID: Christine Cooke, female    DOB: 1986/07/01, 38 y.o.   MRN: [id number]   HPI 38 y.o. presents today for Gynecologic Exam (Bartholin cyst swollen, painful left side ) .patient with recurrent bartholin's cyst. Reports she is having a hard time sitting. Last time drained about 4 months ago. No fevers. No tenderness  Patient's last menstrual period was 08/12/2024 (exact date). Period Cycle (Days): 28 Period Duration (Days): 7-10 Period Pattern: Regular Menstrual Flow: Heavy, Light Menstrual Control: Tampon (pad diapers) Dysmenorrhea: (!) Severe Dysmenorrhea Symptoms: Cramping, Nausea, Diarrhea, Headache  Review of Systems  SVE; large 4-5cm left bartholin's cyst  TIME OUT PERFORMED Patient desires I&D Procedure and consent reviewed. Post care instructions discussed with patient.  Procedure: Punch biopsy Patient consented for the procedure with written consent.  The area was cleaned with betadine.  1 cc of lidocaine  with epi was injected subcutaneously.  A 1 mm incision was made at the prior incision scare and brown fluid drained. No word catheter placed.   Hemostasis was achieved without any need for medication.  Patient tolerated the procedure well.  Post care instructions were discussed with patient.   Nuswab collected prior to I&D to ensure proper healing     Objective:    OBGyn Exam  BP 126/74   Pulse 94   Wt 195 lb 9.6 oz (88.7 kg)   LMP 08/12/2024 (Exact Date)   SpO2 98%   BMI 30.64 kg/m  Wt Readings from Last 3 Encounters:  08/22/24 195 lb 9.6 oz (88.7 kg)  04/10/24 191 lb 12.8 oz (87 kg)  02/20/24 196 lb (88.9 kg)        Erica, CMA was present for the procedure  Assessment & Plan:  Recurrent left bartholin's cyst I&D performed with no difficulty. Post care instructions and concerning s/s were discussed. Patient to return with any concerns and with annual exams  Almarie MARLA Carpen

## 2024-08-23 ENCOUNTER — Ambulatory Visit: Payer: Self-pay | Admitting: Obstetrics and Gynecology

## 2024-08-23 ENCOUNTER — Other Ambulatory Visit: Payer: Self-pay | Admitting: Obstetrics and Gynecology

## 2024-08-23 LAB — SURESWAB® ADVANCED VAGINITIS PLUS,TMA
C. trachomatis RNA, TMA: NOT DETECTED
CANDIDA SPECIES: NOT DETECTED
Candida glabrata: NOT DETECTED
N. gonorrhoeae RNA, TMA: NOT DETECTED
SURESWAB(R) ADV BACTERIAL VAGINOSIS(BV),TMA: POSITIVE — AB
TRICHOMONAS VAGINALIS (TV),TMA: NOT DETECTED

## 2024-08-23 MED ORDER — METRONIDAZOLE 500 MG PO TABS: 500.0000 mg | ORAL_TABLET | Freq: Two times a day (BID) | ORAL | 0 refills | Status: AC

## 2024-08-27 ENCOUNTER — Telehealth: Payer: Self-pay | Admitting: *Deleted

## 2024-08-27 NOTE — Telephone Encounter (Signed)
 Left detailed message, ok per dpr. Advised Dr. Glennon is recommending 1 week f/u with Dr. Dallie. Return call to call [facility] Triage at [phone], option 4 to further discuss and schedule.

## 2024-08-27 NOTE — Telephone Encounter (Signed)
-----   Message from Almarie MARLA Carpen sent at 08/27/2024  3:31 PM EST ----- Regarding: RE: appt A week out from drainage Dr. Carpen ----- Message ----- From: Brutus Kate SAILOR, RN Sent: 08/27/2024   3:29 PM EST To: Almarie MARLA Carpen, MD; Will Barnacle Subject: RE: appt                                       Appointment on 08/31/24 was for the I&D procedure.  When does patient need f/u with Dr. Dallie?    [facility] ----- Message ----- From: Carpen Almarie MARLA, MD Sent: 08/27/2024   3:23 PM EST To: Kate SAILOR Brutus, RN Subject: RE: appt                                       Sounds good. Should still see Dr. Dallie and f/u Dr. Carpen ----- Message ----- From: Brutus Kate SAILOR, RN Sent: 08/27/2024   9:11 AM EST To: Almarie MARLA Carpen, MD; Will Barnacle Subject: RE: appt                                       Per review of OV note, she only needs to return for concerns or AEX.   I left the appointment initially until she was seen in office, patient was aware of this.   [facility] ----- Message ----- From: Barnacle Will Sent: 08/23/2024   8:47 AM EST To: Kate SAILOR Brutus, RN Subject: appt                                           Looks like [facility] did procedure on Nov 5 do I need to cancel procedure with Dr Dallie on Nov 14?

## 2024-08-31 ENCOUNTER — Ambulatory Visit: Admitting: Obstetrics and Gynecology

## 2024-09-04 NOTE — Telephone Encounter (Signed)
 Spoke with patient. Patient states work has been busy, unable to schedule f/u.   Patient reports cyst has reduced in size and no longer painful. States she started flagyl  late, still has doses to complete.   Patient asking if f/u is necessary at this time?   Advised I will f/u with provider and reach back out with recommendations. Patient agreeable.   Routing to Dr. Glennon  Cc: Dr. Dallie

## 2024-10-03 NOTE — Telephone Encounter (Addendum)
 Per review of EPIC, patient cancelled f/u on 08/31/24.  AEX scheduled with JC on 10/17/24  Routing FYI.

## 2024-10-17 ENCOUNTER — Ambulatory Visit: Admitting: Radiology
